# Patient Record
Sex: Male | Born: 1950 | Race: White | Hispanic: No | Smoking: Never smoker
Health system: Southern US, Community
[De-identification: ages and names within clinical notes are randomized; demographics above are authoritative.]

## PROBLEM LIST (undated history)

## (undated) DIAGNOSIS — E785 Hyperlipidemia, unspecified: Secondary | ICD-10-CM

## (undated) DIAGNOSIS — G479 Sleep disorder, unspecified: Secondary | ICD-10-CM

## (undated) DIAGNOSIS — S2249XA Multiple fractures of ribs, unspecified side, initial encounter for closed fracture: Secondary | ICD-10-CM

## (undated) DIAGNOSIS — N200 Calculus of kidney: Secondary | ICD-10-CM

## (undated) DIAGNOSIS — S51009A Unspecified open wound of unspecified elbow, initial encounter: Secondary | ICD-10-CM

## (undated) DIAGNOSIS — S53106A Unspecified dislocation of unspecified ulnohumeral joint, initial encounter: Secondary | ICD-10-CM

## (undated) DIAGNOSIS — I319 Disease of pericardium, unspecified: Secondary | ICD-10-CM

## (undated) DIAGNOSIS — R0602 Shortness of breath: Secondary | ICD-10-CM

## (undated) DIAGNOSIS — C439 Malignant melanoma of skin, unspecified: Secondary | ICD-10-CM

## (undated) DIAGNOSIS — J309 Allergic rhinitis, unspecified: Secondary | ICD-10-CM

## (undated) DIAGNOSIS — R0600 Dyspnea, unspecified: Secondary | ICD-10-CM

## (undated) DIAGNOSIS — S2239XA Fracture of one rib, unspecified side, initial encounter for closed fracture: Secondary | ICD-10-CM

## (undated) DIAGNOSIS — E669 Obesity, unspecified: Secondary | ICD-10-CM

## (undated) HISTORY — DX: Calculus of kidney: N20.0

## (undated) HISTORY — DX: Disease of pericardium, unspecified: I31.9

## (undated) HISTORY — DX: Multiple fractures of ribs, unspecified side, initial encounter for closed fracture: S22.49XA

## (undated) HISTORY — DX: Shortness of breath: R06.02

## (undated) HISTORY — DX: Obesity, unspecified: E66.9

## (undated) HISTORY — DX: Dyspnea, unspecified: R06.00

## (undated) HISTORY — PX: KNEE ARTHROSCOPY: SHX127

## (undated) HISTORY — DX: Unspecified open wound of unspecified elbow, initial encounter: S51.009A

## (undated) HISTORY — DX: Malignant melanoma of skin, unspecified: C43.9

## (undated) HISTORY — DX: Sleep disorder, unspecified: G47.9

## (undated) HISTORY — PX: OTHER SURGICAL HISTORY: SHX169

## (undated) HISTORY — DX: Allergic rhinitis, unspecified: J30.9

## (undated) HISTORY — DX: Unspecified dislocation of unspecified ulnohumeral joint, initial encounter: S53.106A

## (undated) HISTORY — PX: MELANOMA EXCISION WITH SENTINEL LYMPH NODE BIOPSY: SHX5267

## (undated) HISTORY — DX: Hyperlipidemia, unspecified: E78.5

## (undated) HISTORY — DX: Fracture of one rib, unspecified side, initial encounter for closed fracture: S22.39XA

---

## 2004-06-04 HISTORY — PX: OTHER SURGICAL HISTORY: SHX169

## 2005-02-24 ENCOUNTER — Inpatient Hospital Stay (HOSPITAL_COMMUNITY): Admission: EM | Admit: 2005-02-24 | Discharge: 2005-02-26 | Payer: Self-pay | Admitting: Emergency Medicine

## 2005-03-01 ENCOUNTER — Emergency Department (HOSPITAL_COMMUNITY): Admission: EM | Admit: 2005-03-01 | Discharge: 2005-03-01 | Payer: Self-pay | Admitting: Emergency Medicine

## 2005-03-16 ENCOUNTER — Ambulatory Visit: Payer: Self-pay | Admitting: Family Medicine

## 2005-03-26 ENCOUNTER — Ambulatory Visit: Payer: Self-pay | Admitting: Family Medicine

## 2005-03-29 ENCOUNTER — Ambulatory Visit: Payer: Self-pay | Admitting: Family Medicine

## 2005-04-03 ENCOUNTER — Ambulatory Visit: Payer: Self-pay | Admitting: Internal Medicine

## 2005-04-13 ENCOUNTER — Ambulatory Visit: Payer: Self-pay

## 2005-05-02 ENCOUNTER — Ambulatory Visit: Payer: Self-pay | Admitting: Family Medicine

## 2005-05-09 ENCOUNTER — Ambulatory Visit: Payer: Self-pay | Admitting: Internal Medicine

## 2005-05-11 ENCOUNTER — Ambulatory Visit: Payer: Self-pay

## 2005-06-22 ENCOUNTER — Ambulatory Visit (HOSPITAL_COMMUNITY): Admission: RE | Admit: 2005-06-22 | Discharge: 2005-06-23 | Payer: Self-pay | Admitting: Orthopedic Surgery

## 2005-09-05 ENCOUNTER — Ambulatory Visit: Payer: Self-pay | Admitting: Family Medicine

## 2005-10-17 ENCOUNTER — Ambulatory Visit: Payer: Self-pay | Admitting: Family Medicine

## 2005-12-07 ENCOUNTER — Ambulatory Visit: Payer: Self-pay | Admitting: Family Medicine

## 2006-04-17 ENCOUNTER — Ambulatory Visit: Payer: Self-pay | Admitting: Family Medicine

## 2006-09-11 ENCOUNTER — Ambulatory Visit: Payer: Self-pay | Admitting: Family Medicine

## 2006-11-26 ENCOUNTER — Ambulatory Visit: Payer: Self-pay | Admitting: Family Medicine

## 2009-02-16 DIAGNOSIS — S53106A Unspecified dislocation of unspecified ulnohumeral joint, initial encounter: Secondary | ICD-10-CM

## 2009-02-16 DIAGNOSIS — R0602 Shortness of breath: Secondary | ICD-10-CM | POA: Insufficient documentation

## 2009-02-16 DIAGNOSIS — I319 Disease of pericardium, unspecified: Secondary | ICD-10-CM | POA: Insufficient documentation

## 2009-02-16 DIAGNOSIS — S51009A Unspecified open wound of unspecified elbow, initial encounter: Secondary | ICD-10-CM | POA: Insufficient documentation

## 2009-02-16 DIAGNOSIS — E669 Obesity, unspecified: Secondary | ICD-10-CM | POA: Insufficient documentation

## 2009-02-16 DIAGNOSIS — E785 Hyperlipidemia, unspecified: Secondary | ICD-10-CM

## 2009-02-16 DIAGNOSIS — Z9889 Other specified postprocedural states: Secondary | ICD-10-CM

## 2009-02-16 DIAGNOSIS — J309 Allergic rhinitis, unspecified: Secondary | ICD-10-CM | POA: Insufficient documentation

## 2009-02-16 DIAGNOSIS — R079 Chest pain, unspecified: Secondary | ICD-10-CM

## 2010-10-20 NOTE — Op Note (Signed)
NAMEARVIN, [name]             ACCOUNT NO.:  [id number]   MEDICAL RECORD NO.:  [id number]          PATIENT TYPE:  INP   LOCATION:  1825                         FACILITY:  [facility]   PHYSICIAN:  Doralee Albino. Carola Frost, M.D. DATE OF BIRTH:  July 16, 1950   DATE OF PROCEDURE:  02/24/2005  DATE OF DISCHARGE:                                 OPERATIVE REPORT   PREOPERATIVE DIAGNOSIS:  Right open grade 3 fracture dislocation of the  elbow.   POSTOPERATIVE DIAGNOSES:  1.  Right grade 2 fracture dislocation of the elbow involving the proximal      radius and ulna.  2.  Complete lateral collateral ligament disruption.   PROCEDURES:  1.  Irrigation and debridement of open proximal radius and ulnar fractures      and elbow joint.  2.  Ulnar nerve release/neuroplasty.  3.  Open reduction and internal fixation, proximal ulna olecranon process      and shaft.  4.  Radial head arthroplasty using the [employer] #3 stem, press-fit, and 24-      mm head with standard neck.  5.  Open treatment of elbow dislocation   SURGEON:  Myrene Galas, M.D.   ASSISTANT:  Peter Garter, PA   ANESTHESIA:  General.   COMPLICATIONS:  None.   DRAINS:  None.   TOTAL TOURNIQUET TIME:  None used.   DISPOSITION:  To PACU.   CONDITION:  Stable.   BRIEF SUMMARY OF INDICATION FOR PROCEDURE:  [name] [name] is a 60 year old  left-hand-dominant male who fell 22 feet or so through a skylight,  sustaining a right elbow fracture dislocation with a 2.5-cm open wound.  Preoperatively, he underwent full evaluation by the Trauma Service and with  regard to his elbow injury, a full discussion with myself regarding the  risks and benefits of surgery including the possibility of nerve injury,  vessel injury, infection, delayed union, nonunion, decreased elbow range of  motion as well as the possibility of symptomatic hardware and possible need  for further surgery; the patient understood these risks and wished to  proceed.   BRIEF DESCRIPTION OF PROCEDURE:  [name] [name] was administered an additional  gram of Ancef.  He was taken to the operating room where general anesthesia  was induced.  His right upper extremity was prepped and draped in usual  sterile fashion.  No tourniquet was used.  A curvilinear incision beginning  at the subcutaneous border of the ulna and extending medially around to  incorporate the traumatic incision and then curving midline.  Once it  crossed the tip of the olecranon, the skin edges were excised and underlying  fascia and muscle in the area of the open wound debrided .  We then removed  some small cortical fragments which had no periosteal attachments as well.  We continued the exposure distally, where we identified the ulnar nerve in  the proximal aspect of the wound and  did a complete neuroplasty through the  cubital tunnel and beyond into the proximal aspect of the musculature.  Once  the nerve was freely mobilized, we then were able to use 6 L  of pulsatile  irrigation on the bone fragments including the fragment we suspected of  penetrating through the skin, which was based off the olecranon.  Prior to  using the Pulsavac, we curetted all the bone surfaces and again did a  careful surgical debridement.   New drapes were placed and also surgical attire changed and then the deep  excision extended somewhat both proximally and distally along.  We began  with evaluation of the radial head and neck.  The radial head had completely  displaced from the radial shaft and was actually posterior to the distal  humerus at the time of exposure; it was removed prior to the irrigation and  was measured on the back table; it was actually greater than the 24-mm  template available.  We then took a micro-sagittal saw and while delivering  the radial shaft through the olecranon fracture, made a transverse cut which  we then rasped to a smooth, well-formed surface.  It was  sequentially  broached up to a 3 and then the trial implanted.  The elbow was reduced and  the 24 appeared to give an excellent fit, both for height and diameter.  We  then inserted the actual stem and engaged the head with the [name] [name] taper  after first impacting the stem.  The proximal radius was then relocated and  was found have excellent stability and fit.  Attention was then turned to  the olecranon and specifically the coronoid fragment.  The coronoid fragment  actually consisted of 2 pieces and these pieces both had periosteal  attachments with the joint capsule actually attaching to the larger coronoid  segment.  The small piece was reduced to the larger and then fixed with a  threaded guidewire, which was then snipped off at the edge of the extra-  articular surface where it was inserted.  We were able to visualize this  reduction directly and restore it anatomically.  This piece was then  examined to see if it articulated with the olecranon, which it did.  Because  of its sagittal split, however, it was not suitable for screw fixation and  was going to be very prone to fragmentation; consequently, we made at 2  drill holes, 1 through each segment, and passed a #5 FiberWire up through  this, through the capsule and then back down through the other a segment of  the coronoid.  Matching drill holes were then placed into the olecranon and  reduction maneuver performed and examined immediately for reduction and then  tied securely.  These segments then moved as a unit.  A knife was used to  tease back the periosteal edges of the major olecranon segment and the major  shaft segment.  The shaft segment did have an angle which dovetailed nicely  into the proximal piece; this was used to line up and hold the reduction  while a provisional fixation was placed in the spiked olecranon plate.  The  olecranon plate was engaged and held with a large K-wire and then a single 3.5 screw placed in  compression mode distally.  A second screw was placed  distally and then the 2 small  2.7 olecranon screws, leaving them short of  the articular surface.  We then were able to tease multiple comminuted  segments into near-anatomic reduction; the largest of these was lagged into  place under direct visualization.  One additional screw was placed in the  shaft and then the C-arm brought in to obtain  AP and lateral images, which  showed reduction of the elbow joint, appropriate length of the shaft  segments of the ulna as well as proper placement of all fixation.  The elbow  could be brought into full extension without dislocation or subluxation and  similarly, while flexed to 90, it could also be externally rotated in order  to obtain a lateral without any subluxation as well.  The patient was  brought back up to 90 degrees and then the forearm checked for full  pronation and supination, which he had; similarly, his DRUJ was stable to  examination.  The wound was then irrigated once more and closed in standard  layered fashion using 0 Vicryl for the deep periosteum and deep tissues  without a formal closure of the forearm compartments to allow for swelling.  We did repair the tissues around the medial aspect of the elbow underneath  the ulnar nerve rather than over it in order to prevent any tension or  pressure.  The nerve remained in a relaxed position.  It should be noted  that the medial aspect of the epicondyle did have the attachment to the  capsule and capitellum; however, digital examination of the lateral  epicondyle during the initial exposure revealed it to be completely stripped  of soft tissue attachments.  The subcu was closed with 2-0 and the skin with  staples.  A sterile gently compressive dressing was applied and then a  posterior splint with the elbow at 90 degrees in neutral rotation.  The  patient was awakened from anesthesia and transported the PACU in stable   condition.   PROGNOSIS:  [name] [name] has sustained a severe injury, but fortunately did  not have any large segmental defects from his open fractures and was able to  have restoration of near anatomic alignment with regard to his olecranon.  The radial head fit nicely and together, this did provide him with a stable  elbow after full extension.  Because of the open fracture, he remains at  increased risk for infection, delayed union and nonunion.  If his wound  looks good in 2 days, then we will discontinue antibiotics and discharge him  home in a splint at 90 degrees.  In 10 days, we will initiate passive range  of motion.      Doralee Albino. Carola Frost, M.D.  Electronically Signed     MHH/MEDQ  D:  02/24/2005  T:  02/26/2005  Job:  [id number]

## 2010-10-20 NOTE — Discharge Summary (Signed)
[name], [name]             ACCOUNT NO.:  [id number]   MEDICAL RECORD NO.:  [id number]          PATIENT TYPE:  INP   LOCATION:  5004                         FACILITY:  [facility]   PHYSICIAN:  Doralee Albino. Carola Frost, M.D. DATE OF BIRTH:  03/30/1951   DATE OF ADMISSION:  02/24/2005  DATE OF DISCHARGE:  02/26/2005                                 DISCHARGE SUMMARY   ADMISSION DIAGNOSIS:  Right open elbow fracture.   DISCHARGE DIAGNOSIS:  Status post open reduction and internal fixation,  right open elbow fracture.   PROCEDURE PERFORMED:  On February 24, 2005, irrigation and debridement of  right open elbow fracture as well as open reduction and internal fixation.   Consult obtained by trauma service.   HISTORY OF PRESENT ILLNESS:  This is a 60 year old white male who fell  through a skylight while working, was subsequently transported to [facility]  emergency room, where the trauma service initially evaluated this patient  and orthopedics was subsequently consulted.  His chief complaint was pain in  his right elbow, which had an obvious deformity as well as obvious open  aspect to this injury.   PAST MEDICAL HISTORY:  He has increased cholesterol, right knee surgery  while an adolescent, and decreased hearing.   PAST SURGICAL HISTORY:  Right knee surgery.   ALLERGIES:  AUGMENTIN.   MEDICATIONS:  A cholesterol medicine as well as an aspirin.   SOCIAL HISTORY:  Denies any tobacco.  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   PHYSICAL EXAMINATION:  VITAL SIGNS:  He is afebrile, vital signs stable.  ABDOMEN:  Soft, nontender.  EXTREMITIES:  He is distally neurologically and vascularly intact, moving  all four extremities, upper and lower.  He is nontender over all extremities  except for the right upper extremity, more specifically the elbow region.  He is also losing minimal amounts of blood in this time in the right upper  extremity through the open wound to the right elbow.   ASSESSMENT:  The  patient has a right elbow fracture.   Initial chest x-ray showed a possible widened mediastinum.  Trauma service  did order a CT angiogram of the chest and pelvis to determine if there is  any further injury internally.  Those tests did come back negative.  The  patient was then subsequently cleared to proceed with surgery and would be  admitted to Dr. Myrene Galas for further care and treatment while in the  hospital.  On February 24, 2005, the patient then underwent irrigation and  debridement as well as open reduction and internal fixation of his right  elbow fracture.  He tolerated this procedure very well without any  complications.  On postop day 1, the patient is seen, pain is well-  controlled on pain medicine.  Distal neurologic and vascular exam remains  intact in bilateral upper extremities.  Splint and Ace are in place in the  right upper extremity.  We will anticipate discharge in the a.m.  February 26, 2005, the patient is seen once again doing even better than the day  before.  We will repeat x-rays today  prior to discharge, AP and lateral of  his right elbow.  Films were of poor quality initially postoperatively.  He  remains intact.  Incision is clean and intact and well-approximated without  erythema.  Splint is removed and will be replaced by the orthopedic tech  prior to discharge and after x-ray is obtained.  The patient is to be  discharged to home, follow up with Dr. Myrene Galas in approximately 10  days.  The patient will be given to resume all home medicines as well as is  given a prescription for Percocet and oxycodone for pain and breakthrough  pain.  The patient is to call the office with any further questions or  concerns prior to his follow-up date.      Cecil Cranker, PA      Doralee Albino. Carola Frost, M.D.  Electronically Signed    RWC/MEDQ  D:  02/26/2005  T:  02/27/2005  Job:  [id number]

## 2010-10-20 NOTE — Op Note (Signed)
[name], [name]             ACCOUNT NO.:  [id number]   MEDICAL RECORD NO.:  [id number]          PATIENT TYPE:  OIB   LOCATION:  5020                         FACILITY:  [facility]   PHYSICIAN:  Doralee Albino. Carola Frost, M.D. DATE OF BIRTH:  04/10/1951   DATE OF PROCEDURE:  06/22/2005  DATE OF DISCHARGE:                                 OPERATIVE REPORT   PREOPERATIVE DIAGNOSIS:  Right ulnar nonunion.   POSTOPERATIVE DIAGNOSIS:  Right ulnar nonunion.   PROCEDURE:  1.  Repair of right ulnar nonunion using iliac crest bone with tricortical      graft.  2.  Removal of deep implant  3.  Manipulation under anesthesia, right elbow   SURGEON:  Doralee Albino. Carola Frost, M.D.   ASSISTANT:  None.   ANESTHESIA:  General.   COMPLICATIONS:  None.   SPECIMENS:  Bone from the nonunion site sent to microbiology for Gram stain  and culture.   TOTAL TOURNIQUET TIME:  Eighty-three minutes.   DISPOSITION:  To PACU.   CONDITION:  Stable.   INDICATIONS FOR PROCEDURE:  [name] [name] is a 60 year old right-hand  dominant male who sustained a open right elbow fracture dislocation treated  with irrigation, debridement, repair of the ulna and radial head  arthroplasty. The patient has had persistent pain despite extensive rehab  and tenderness at the fracture site as well. CT scan confirmed ulnar  nonunion with a segmental defect. We discussed at length [name] the  risk and benefits of repair of this nonunion as well as the specifics  associated with iliac crest versus allograft. He understood those risks to  include at hip pain, nerve injury including lateral thigh numbness,  infection, neurovascular injury, failure of the nonunion site to progress  toward union and possible need for further surgery as well as other  perioperative complications up. He wished to proceed.   DESCRIPTION OF PROCEDURE:  [name] was administered preoperative Ancef,  taken to operating room where his right upper  extremity was prepped and  draped in the usual sterile fashion after induction of general anesthesia.  We also prepped out his right hip. We began with a 3 cm incision over the  right iliac crest and continued through the subcutaneous fascia until we  encountered the fascial layer from the obliques. We stayed within the  fascial plane all way down to the iliac crest and then elevated the  periosteum proximally with use of the Bovie. We then packed the wound and  turned our attention to the elbow.   We reopened the prior incision but did not extended it up the back of the  humerus. Dissection was carried sharply down to the plate which was then  exposed through elevation of the pseudomembrane. Dissection carried medially  down the side of the ulna where the nonunion site was encountered. There was  extensive fibrous debris within the nonunion site but no fluid collections.  No evidence of infection whatsoever. Curets were used to debride all of this  material. There did appear to be slightly whitened appearance of the distal  aspect of the proximal fragment suggestive  of necrosis. Immediately adjacent  to the last 3 mm or so, however, the bone did show evidence of bleeding when  drilled and rasped. Because of the size of this segment, it was left in  place given the ability of the bone to grow over it like a scaffold and the  absence of any appearance of infection. The material debrided from this area  was sent to path for a Gram stain and culture; Gram stain would eventually  be called in as negative. We then measured the defect which was  approximately 2-1/2 cm.  We returned to the crest and excised a 3 cm wedge  of tricortical graft, shaped it and then implanted it in the nonunion gap  after a thorough irrigation. We placed two lag screws one from distal and  from proximal into this tricortical segment and sucked it down to either  side to bridge the defect. Long cancellous iliac crest  strips were then  placed alongside of the nonunion site as well and packed into any remaining  defects and entirely spanning the nonunion site. This was all performed  after a thorough irrigation.  We then closed the deep layer of the 0 Vicryl,  deflated the tourniquet and continued with 2-0 Vicryl and staples for  closure.   The iliac crest wound was closed with interrupted figure-of-eight 0 Vicryls  after of removing some Gelfoam had been placed initially into the area of  the graft harvest and then 2-0 for the subcutaneous and staples for the  skin. Sterile gently compressive dressings were applied. The patient was  awakened from anesthesia and transported to the PACU in stable condition  after application of a posterior and lateral slab splint for the elbow up.   PROGNOSIS:  [name] should do well following this repair of his  nonunion. There did not appear to be any evidence of infection and we were  able to use a tricortical graft to completely bridge the defect as well as  multiple cancellous strips of iliac crest. We did also place an additional  screw into the proximal segment for further purchase and this should  alleviate any stress on the implant. We removed a screw that was loose at  that time and I checked all the other hardware which remained well fixed.  Because of his previous open fracture and nonunion he is, of course, at  increased risk for persistent nonunion.      Doralee Albino. Carola Frost, M.D.  Electronically Signed     MHH/MEDQ  D:  06/22/2005  T:  06/22/2005  Job:  [id number]

## 2010-10-20 NOTE — Consult Note (Signed)
NAMEFERRELL, CLAIBORNE             ACCOUNT NO.:  [id number]   MEDICAL RECORD NO.:  [id number]          PATIENT TYPE:  EMS   LOCATION:  MAJO                         FACILITY:  [facility]   PHYSICIAN:  Doralee Albino. Carola Frost, M.D. DATE OF BIRTH:  1950-08-01   DATE OF CONSULTATION:  02/24/2005  DATE OF DISCHARGE:                                   CONSULTATION   REFERRING PHYSICIANS:  1.  Lorre Nick, M.D.  2.  Cherylynn Ridges, M.D.   REASON FOR CONSULTATION:  Open right elbow fracture dislocation.   BRIEF HISTORY OF PRESENT ILLNESS:  Patient is a 60 year old white male who  fell through a skylight onto a cement floor, landing on his right elbow.  He  was transported to the ER for further evaluation.  Widened mediastinum on  initial chest x-ray prompted a CT scan of the chest as well as the head,  abdomen and pelvis.  These were negative for fracture or significant  findings.   PAST MEDICAL HISTORY:  1.  High cholesterol.  2.  Right knee surgery.   PAST SURGICAL HISTORY:  As above.   SOCIAL HISTORY:  The patient does not smoke.  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   MEDICATIONS:  Cholesterol lowering agent and aspirin.   PHYSICAL EXAMINATION:  His vital signs are stable.  He is afebrile.  His  right upper extremity was notable for an open wound over the extensor  surface of the elbow which is currently wrapped.  The bandage was not  removed at the time of evaluation as the patient was scheduled for emergent  surgery.  There was obvious deformity of the elbow.  There was no tenderness  proximally of the humerus or shoulder.  Distally, there was no significant  tenderness of the wrist.  Radial pulses 2+.  Radial, mean and ulnar  sensation was intact as was radial, mean and ulnar motor function including  posterior interosseus and anterior interosseus branches.  His left upper  extremity was free of focal tenderness, ecchymosis or swelling.  His pelvis  was stable, nontender  to palpation.  His right and left lower extremities  about the hips, knees, ankles and feet were nontender without swelling or  ecchymosis. Deep peroneal, superficial peroneal and tibial nerve sensory and  motor function was intact.  Dorsalis pedis pulses 2+ bilaterally.   X-rays demonstrate highly comminuted fracture of the proximal radius and  ulna with dislocation.  There does not appear to be any fracture nor  dislocation at the DRUJ.  Similarly the one view provided of the proximal  humerus does not show  fracture abnormality.  CT scan was also obtained  which, on the axial cuts, which are all that are available for review at the  current dictation, do not demonstrate any involvement of the distal humerus.   ASSESSMENT:  Open grade III proximal radius and ulnar fracture dislocation  in nondominant arm.   PLAN:  I have discussed with Mr. Millikan and has also been discussed with  his daughter, the possible treatments and our recommendation for immediate  irrigation  and debridement with primary fixation of this intra-articular  fracture given the anticipated lack of contamination.  Should the wound be  grossly contaminated or there are concerns in that regard, then we would  likely apply an external fixator provisionally and schedule a return to the  OR for definitive fixation.  He is at increased risk for infection, delayed  union, nonunion, decreased range of motion, arthritis.  He is also at risk  for nerve and vessel injection secondary to the comminuted nature and the  required exposure for this fracture.  After full discussion of the risks and  benefits, the patient and his daughter wish to proceed.      Doralee Albino. Carola Frost, M.D.  Electronically Signed     MHH/MEDQ  D:  02/24/2005  T:  02/26/2005  Job:  [id number]   cc:   Lorre Nick, M.D.  Fax: [phone]   Cherylynn Ridges, M.D.  [address], [address]  [place]  [place] [address]

## 2011-01-19 DIAGNOSIS — C439 Malignant melanoma of skin, unspecified: Secondary | ICD-10-CM

## 2011-01-19 HISTORY — DX: Malignant melanoma of skin, unspecified: C43.9

## 2011-01-29 ENCOUNTER — Ambulatory Visit (INDEPENDENT_AMBULATORY_CARE_PROVIDER_SITE_OTHER): Payer: Self-pay | Admitting: General Surgery

## 2011-04-25 DIAGNOSIS — I890 Lymphedema, not elsewhere classified: Secondary | ICD-10-CM | POA: Insufficient documentation

## 2011-05-24 ENCOUNTER — Ambulatory Visit: Payer: Self-pay | Admitting: Family Medicine

## 2011-06-01 ENCOUNTER — Ambulatory Visit (INDEPENDENT_AMBULATORY_CARE_PROVIDER_SITE_OTHER): Payer: Self-pay | Admitting: Family Medicine

## 2011-06-01 DIAGNOSIS — I10 Essential (primary) hypertension: Secondary | ICD-10-CM

## 2011-06-01 DIAGNOSIS — G4700 Insomnia, unspecified: Secondary | ICD-10-CM

## 2011-06-13 ENCOUNTER — Ambulatory Visit: Payer: Self-pay

## 2011-06-13 DIAGNOSIS — S20219A Contusion of unspecified front wall of thorax, initial encounter: Secondary | ICD-10-CM

## 2011-06-13 DIAGNOSIS — S2249XA Multiple fractures of ribs, unspecified side, initial encounter for closed fracture: Secondary | ICD-10-CM

## 2011-06-20 ENCOUNTER — Ambulatory Visit: Payer: Self-pay

## 2011-06-20 DIAGNOSIS — S2239XA Fracture of one rib, unspecified side, initial encounter for closed fracture: Secondary | ICD-10-CM

## 2011-06-27 ENCOUNTER — Ambulatory Visit: Payer: Self-pay | Admitting: Family Medicine

## 2011-06-27 DIAGNOSIS — I10 Essential (primary) hypertension: Secondary | ICD-10-CM

## 2011-07-04 ENCOUNTER — Ambulatory Visit (INDEPENDENT_AMBULATORY_CARE_PROVIDER_SITE_OTHER): Payer: Self-pay | Admitting: Emergency Medicine

## 2011-07-04 VITALS — BP 138/84 | HR 60 | Temp 98.7°F | Resp 18 | Ht 69.0 in | Wt 250.0 lb

## 2011-07-04 DIAGNOSIS — K439 Ventral hernia without obstruction or gangrene: Secondary | ICD-10-CM

## 2011-07-04 DIAGNOSIS — C439 Malignant melanoma of skin, unspecified: Secondary | ICD-10-CM

## 2011-07-04 DIAGNOSIS — G4733 Obstructive sleep apnea (adult) (pediatric): Secondary | ICD-10-CM

## 2011-07-04 NOTE — Progress Notes (Signed)
  Subjective:    Patient ID: Mark [name], male    DOB: 04-24-1951, 61 y.o.   MRN: [id number]  HPI Patient complains of fatigue and falls asleep immediately after he sits down.      Review of Systems Falls asleep as soon as he sits down     Objective:   Physical Exam  Constitutional: He appears well-developed.  Cardiovascular: Normal rate.   Pulmonary/Chest: No respiratory distress. He has no wheezes. He has no rales.  Abdominal:       Large ventral hernia          Assessment & Plan:  Told to avoid surgery for hernia. Will schedule sleep study

## 2011-07-04 NOTE — Patient Instructions (Signed)
Told to avoid surgery for hernia. Will contact regarding sleep study

## 2011-07-05 ENCOUNTER — Telehealth: Payer: Self-pay

## 2011-07-05 DIAGNOSIS — R0781 Pleurodynia: Secondary | ICD-10-CM

## 2011-07-05 NOTE — Telephone Encounter (Signed)
SPOKE WITH PT AND HE WOULD LIKE PERCOCET 5/325 REFILLED FOR HIS PAIN IN [facility]. HE FORGOT TO ASK DR [facility] FOR REFILL

## 2011-07-05 NOTE — Telephone Encounter (Signed)
Pt says he was in to see the Dr last night and her forgot to ask for one of his prescriptions, the pharmacy has never filled this for him

## 2011-07-06 ENCOUNTER — Other Ambulatory Visit: Payer: Self-pay | Admitting: Family Medicine

## 2011-07-06 ENCOUNTER — Telehealth: Payer: Self-pay

## 2011-07-06 DIAGNOSIS — R0781 Pleurodynia: Secondary | ICD-10-CM

## 2011-07-06 MED ORDER — OXYCODONE-ACETAMINOPHEN 5-325 MG PO TABS: 1.0000 | ORAL_TABLET | Freq: Three times a day (TID) | ORAL | Status: AC | PRN

## 2011-07-06 MED ORDER — OXYCODONE-ACETAMINOPHEN 5-325 MG PO TABS: 1.0000 | ORAL_TABLET | Freq: Three times a day (TID) | ORAL | Status: DC | PRN

## 2011-07-06 NOTE — Telephone Encounter (Signed)
Spoke with pt and told him Rx ready for p/up. Pt will get tomorrow.

## 2011-07-06 NOTE — Telephone Encounter (Signed)
Checked to make sure that Percocet Rx was in drawer for p/up and it was not. Pt had called previously requesting RF. Called pt back and told him Rx is not ready for p/up and provider will have to review to see if it can be filled, and we will let him know at that time. Pt agreed.

## 2011-07-07 NOTE — Telephone Encounter (Signed)
Left message on machine that prescription is at front desk to pick up.

## 2011-07-26 ENCOUNTER — Telehealth: Payer: Self-pay

## 2011-07-26 NOTE — Telephone Encounter (Signed)
.  [facility] ANN FROM [facility] IS A THERAPIST AND WOULD LIKE TO SPEAK WITH DR MCPHERSON OR HER NURSE REGARDING PT'S BLOOD PRESSURE SHE IS REALLY CONCERNED ABOUT IT. PLEASE CALL [phone]

## 2011-07-26 NOTE — Telephone Encounter (Signed)
Thank you for that information. I think that was the appropriate course of action as I did not have an opportunity to return her call immediately.

## 2011-07-26 NOTE — Telephone Encounter (Signed)
Spoke with therapist at [facility].  She stated that pt bp was high, lethargic, sob, and falling asleep when bp was being taken.  She sent pt down to the ER.  Dr. Barbarann Ehlers

## 2011-07-27 ENCOUNTER — Ambulatory Visit: Payer: Self-pay | Admitting: Family Medicine

## 2011-07-27 ENCOUNTER — Telehealth: Payer: Self-pay

## 2011-07-27 NOTE — Telephone Encounter (Signed)
Patient was put on BP med from Dr Audria Nine.  His BP was 96/50 today. He feels light headed.  He scheduled an appointment for next wed am, but would like to speak to someone prior since he is having problems.  Additionally, he was supposed to be scheduled for a sleep study per Dr Cleta Alberts and he has not heard anything yet.  Please call him back.

## 2011-07-27 NOTE — Telephone Encounter (Signed)
I spoke with the pt who c/o lightheadedness and episodes of low BP.  BP readings had been 130/70 on current medication which is 1/2 tablet daily. His Occupational Therapy is on hold because of BP instability. He is also wondering about Sleep Study because of daytime fatigue and sleepiness.  He is advised to stop BP med until seen by me on Wed AM and limit activities. He will monitor BP and log them for review at visit. He voices understanding.

## 2011-07-27 NOTE — Telephone Encounter (Signed)
Dr Orson Ape pleas advise on BP.  I did not see an order for sleep study.

## 2011-07-31 ENCOUNTER — Encounter: Payer: Self-pay | Admitting: *Deleted

## 2011-08-01 ENCOUNTER — Encounter: Payer: Self-pay | Admitting: Family Medicine

## 2011-08-01 ENCOUNTER — Ambulatory Visit (INDEPENDENT_AMBULATORY_CARE_PROVIDER_SITE_OTHER): Payer: Self-pay | Admitting: Family Medicine

## 2011-08-01 ENCOUNTER — Other Ambulatory Visit: Payer: Self-pay | Admitting: Emergency Medicine

## 2011-08-01 DIAGNOSIS — G4733 Obstructive sleep apnea (adult) (pediatric): Secondary | ICD-10-CM

## 2011-08-01 DIAGNOSIS — I10 Essential (primary) hypertension: Secondary | ICD-10-CM

## 2011-08-01 DIAGNOSIS — R197 Diarrhea, unspecified: Secondary | ICD-10-CM

## 2011-08-01 DIAGNOSIS — R609 Edema, unspecified: Secondary | ICD-10-CM

## 2011-08-01 LAB — LIPID PANEL
HDL: 24 mg/dL — ABNORMAL LOW (ref >39)
Triglycerides: 158 mg/dL — ABNORMAL HIGH (ref <150)

## 2011-08-01 LAB — COMPREHENSIVE METABOLIC PANEL
ALT: 21 U/L (ref 0–53)
AST: 24 U/L (ref 0–37)
CO2: 21 mEq/L (ref 19–32)
Chloride: 107 mEq/L (ref 96–112)
Total Protein: 6.5 g/dL (ref 6.0–8.3)

## 2011-08-01 NOTE — Progress Notes (Signed)
  Subjective:    Patient ID: Mark [name], male    DOB: 09/11/1950, 61 y.o.   MRN: [id number]  HPI   This 61 y.o. Cauc male returns for BP evaluation, having had episodes of low BP which has  resulted in interruption of Occupational Therapy. Has continued to exercise on treadmill for 20  minutes   twice a day;  Has 2 cups of regular coffee every morning. Now has gurgling in stomach for tw days  with loose stools. Has hx of indigestion requiring TUMS but not recently. No bad food lately and no exposure  to sick persons.  Has had a few spots of blood due to hemorrhoids but no frank bleeding. Denies abdominal  pain.    Review of Systems  Constitutional: Negative for fever, chills, appetite change and fatigue.  Cardiovascular: Positive for leg swelling. Negative for chest pain.       Right leg swelling- chronic  Gastrointestinal: Positive for diarrhea, blood in stool and abdominal distention. Negative for nausea, vomiting and abdominal pain.  Neurological: Negative.   All other systems reviewed and are negative.       Objective:   Physical Exam  Nursing note and vitals reviewed. Constitutional: He is oriented to person, place, and time. He appears well-developed and well-nourished. No distress.  HENT:  Head: Normocephalic and atraumatic.  Cardiovascular: Normal rate and regular rhythm.   Pulmonary/Chest: Effort normal. No respiratory distress.  Abdominal: He exhibits distension. He exhibits no mass. There is no tenderness. There is no guarding.  Neurological: He is alert and oriented to person, place, and time.  Skin: Skin is warm and dry. No pallor.          Assessment & Plan:   1. HTN (hypertension)  Comprehensive metabolic panel, CBC, Lipid panel Continue to hold BP med for now and monitor  2. Edema  Vitamin D, 25-hydroxy  3. Diarrhea  H. pylori antibody, IgG

## 2011-08-01 NOTE — Patient Instructions (Signed)
Helicobacter Pylori Disease Often patients with stomach or duodenal ulcers not caused by irritants, are infected with a germ. The germ is called helicobacter pylori (H. pylori). This bacterium lives on the surface of stomach and small bowel. It can cause redness, soreness and ulcers. Ulcers are a hole in the lining of your stomach or small bowel. Blood and special breath tests can detect if you are infected with H. pylori. Tests can be done on samples taken from the stomach if you have endoscopy. After treatment you may have tests to prove you are cured. These can be done about a month after you finish the treatment or as your caregiver suggests. Most infections can be cured with a combination of antibiotics. Antibiotics are medications which kill germs such as H. pylori. Anti-ulcer medicines which block stomach acid secretion may also be used. Treatment will be continued for the time your caregiver suggests. Call your caregiver if you need more information about H. pylori. Call also if your symptoms get worse during or after treatment. You will not need a special diet. Avoid:  Smoking.   Aspirin.   Ibuprofen.   Other anti-inflammatory drugs.  Alcohol and spicy foods may also make your symptoms worse. The best advice is to avoid anything you find upsetting to your stomach. SEEK IMMEDIATE MEDICAL CARE IF:  You develop sharp, sudden, lasting stomach pain.   You have bloody vomit or vomit that looks like coffee grounds.   You have bloody or black stools.   You develop a lightheaded feeling, fainting, or become weak and sweaty.  Document Released: 05/21/2005 Document Revised: 01/31/2011 Document Reviewed: 11/06/2006 [facility] Patient Information 2012 [place], [place].

## 2011-08-02 ENCOUNTER — Other Ambulatory Visit: Payer: Self-pay | Admitting: Family Medicine

## 2011-08-02 ENCOUNTER — Telehealth: Payer: Self-pay

## 2011-08-02 DIAGNOSIS — R197 Diarrhea, unspecified: Secondary | ICD-10-CM

## 2011-08-02 LAB — CBC
HCT: 40.8 % (ref 39.0–52.0)
Hemoglobin: 12.7 g/dL — ABNORMAL LOW (ref 13.0–17.0)
MCH: 26.3 pg (ref 26.0–34.0)

## 2011-08-02 LAB — H. PYLORI ANTIBODY, IGG: H Pylori IgG: 0.68 {ISR}

## 2011-08-02 NOTE — Telephone Encounter (Signed)
Pt needs to do stool collection for cultures to rule out Clostridium and routine cultures ( not sure if he consumed something that is causing this). Call pt to come to [address]  on Friday  AM (08/03/11)  to pick up  Stools collection kit. I will place the future order and Ramona will advise him what to do with the Kit when he comes to pick it up.

## 2011-08-02 NOTE — Telephone Encounter (Signed)
Pt called back to report he had about 12 episodes of watery diarrhea, with nothing formed, since he saw Dr Audria Nine yesterday and there was a little more blood in it than just spots, although still not a lot of blood. Pt has no fever, no nausea,abdominal pain and reports he is drinking a lot of water and Gatorade and eating well. But wanted to report increase in episodes and blood. Any new instructions bf we get test results back?

## 2011-08-02 NOTE — Telephone Encounter (Signed)
Pt was seen yesterday by Dr Audria Nine.  He has been ill all evening and would like someone to call.

## 2011-08-03 ENCOUNTER — Telehealth: Payer: Self-pay

## 2011-08-03 NOTE — Telephone Encounter (Signed)
Stool tests not necessary since pt is so much better.

## 2011-08-03 NOTE — Telephone Encounter (Signed)
Dr. Audria Nine has not reviewed labs. Spoke with patient and was notified will call as soon as labs reviewed.

## 2011-08-03 NOTE — Telephone Encounter (Signed)
.  [facility] PT HAD LABS DONE AND WOULD LIKE TO KNOW RESULTS PLEASE CALL [phone]

## 2011-08-03 NOTE — Progress Notes (Signed)
Quick Note:  Please call pt and advise that the following labs are abnormal... All his results look good except Vitamin D level is low. Advise OTC Vitamin D 1000 IU capsules and take 1 cap every day. ______

## 2011-08-03 NOTE — Telephone Encounter (Signed)
Called pt to give instructions to test and he stated he is so much better. He has only had one more BM since we spoke yesterday and is getting appetite back, and just feels better. He thinks he probably had a stomach virus, or ate something that upset him. He will call back if worsens/or persists to come get kit. Dr Audria Nine, please advise if you still want him to do stool test now.

## 2011-08-08 ENCOUNTER — Other Ambulatory Visit: Payer: Self-pay | Admitting: Family Medicine

## 2011-08-10 ENCOUNTER — Other Ambulatory Visit: Payer: Self-pay | Admitting: Emergency Medicine

## 2011-08-10 DIAGNOSIS — G4733 Obstructive sleep apnea (adult) (pediatric): Secondary | ICD-10-CM

## 2011-08-15 ENCOUNTER — Ambulatory Visit: Payer: Self-pay | Admitting: Family Medicine

## 2011-08-15 ENCOUNTER — Encounter: Payer: Self-pay | Admitting: Family Medicine

## 2011-08-15 VITALS — BP 148/95 | HR 77 | Temp 98.2°F | Resp 16 | Ht 68.0 in | Wt 249.0 lb

## 2011-08-15 DIAGNOSIS — I10 Essential (primary) hypertension: Secondary | ICD-10-CM

## 2011-08-15 DIAGNOSIS — J309 Allergic rhinitis, unspecified: Secondary | ICD-10-CM

## 2011-08-15 MED ORDER — HYDROCHLOROTHIAZIDE 25 MG PO TABS: ORAL_TABLET | ORAL | Status: DC

## 2011-08-15 MED ORDER — CETIRIZINE HCL 10 MG PO TABS: ORAL_TABLET | ORAL | Status: DC

## 2011-08-15 NOTE — Patient Instructions (Signed)
Allergic Rhinitis  Allergic rhinitis is when the mucous membranes in the nose respond to allergens. Allergens are particles in the air that cause your body to have an allergic reaction. This causes you to release allergic antibodies. Through a chain of events, these eventually cause you to release histamine into the blood stream (hence the use of antihistamines). Although meant to be protective to the body, it is this release that causes your discomfort, such as frequent sneezing, congestion and an itchy runny nose.    CAUSES    The pollen allergens may come from grasses, trees, and weeds. This is seasonal allergic rhinitis, or "hay fever." Other allergens cause year-round allergic rhinitis (perennial allergic rhinitis) such as house dust mite allergen, pet dander and mold spores.    SYMPTOMS     Nasal stuffiness (congestion).   Runny, itchy nose with sneezing and tearing of the eyes.   There is often an itching of the mouth, eyes and ears.  It cannot be cured, but it can be controlled with medications.  DIAGNOSIS    If you are unable to determine the offending allergen, skin or blood testing may find it.  TREATMENT     Avoid the allergen.   Medications and allergy shots (immunotherapy) can help.   Hay fever may often be treated with antihistamines in pill or nasal spray forms. Antihistamines block the effects of histamine. There are over-the-counter medicines that may help with nasal congestion and swelling around the eyes. Check with your caregiver before taking or giving this medicine.  If the treatment above does not work, there are many new medications your caregiver can prescribe. Stronger medications may be used if initial measures are ineffective. Desensitizing injections can be used if medications and avoidance fails. Desensitization is when a patient is given ongoing shots until the body becomes less sensitive to the allergen. Make sure you follow up with your caregiver if problems continue.  SEEK  MEDICAL CARE IF:     You develop fever (more than 100.5 F (38.1 C).   You develop a cough that does not stop easily (persistent).   You have shortness of breath.   You start wheezing.   Symptoms interfere with normal daily activities.  Document Released: 02/13/2001 Document Revised: 05/10/2011 Document Reviewed: 08/25/2008  [employer] Patient Information 2012 [employer].

## 2011-08-15 NOTE — Progress Notes (Signed)
  Subjective:    Patient ID: Mark [name], male    DOB: [birth year], 61 y.o.   MRN: [id number]  HPI  The pt RTC for BP re-check. HE has been off medication and readings at home  130- 180/ 85-105.  He denies any symptoms such as chest pain, dizziness, palpitations or lightheadedness. He had a brief episode   or diarrhea with nausea; this resolved with 24-48 hours and he feels fine.  Review of Systems  As per HPI     Objective:   Physical Exam  Vitals reviewed. Constitutional: He is oriented to person, place, and time. He appears well-developed and well-nourished. No distress.  HENT:  Head: Normocephalic and atraumatic.  Nose: Nose normal.  Mouth/Throat: Oropharynx is clear and moist.  Eyes: Conjunctivae and EOM are normal. No scleral icterus.  Cardiovascular: Normal rate, regular rhythm and normal heart sounds.   Pulmonary/Chest: Effort normal and breath sounds normal. No respiratory distress.  Musculoskeletal: He exhibits no edema.  Neurological: He is alert and oriented to person, place, and time. No cranial nerve deficit.  Skin: Skin is warm and dry.  Psychiatric: He has a normal mood and affect. His behavior is normal.          Assessment & Plan:   1. HTN (hypertension)       Rx; HCTZ 25 mg   1/2 tab every AM  Also Rx:  Cetirizine  10 mg  1 tab daily  every PM for allergy symptoms.

## 2011-08-20 ENCOUNTER — Encounter: Payer: Self-pay | Admitting: Family Medicine

## 2011-09-19 ENCOUNTER — Encounter: Payer: Self-pay | Admitting: Family Medicine

## 2011-09-19 ENCOUNTER — Ambulatory Visit (INDEPENDENT_AMBULATORY_CARE_PROVIDER_SITE_OTHER): Payer: Self-pay | Admitting: Family Medicine

## 2011-09-19 VITALS — BP 131/91 | HR 74 | Temp 96.9°F | Resp 18 | Ht 67.0 in | Wt 251.0 lb

## 2011-09-19 DIAGNOSIS — K625 Hemorrhage of anus and rectum: Secondary | ICD-10-CM

## 2011-09-19 DIAGNOSIS — I10 Essential (primary) hypertension: Secondary | ICD-10-CM

## 2011-09-19 MED ORDER — LISINOPRIL 5 MG PO TABS: 5.0000 mg | ORAL_TABLET | Freq: Every day | ORAL | Status: DC

## 2011-09-19 NOTE — Progress Notes (Signed)
  Subjective:    Patient ID: Mark [name], male    DOB: 12-May-1951, 61 y.o.   MRN: [id number]  HPI The pt returns today for recheck prior to returning to work.  He is having lower extremity edema;  he has been trying to stay active and is up on his feet almost all day. He is supposed to wear a compression  stocking at night but he is not some nights. BP at home this AM = 147/102. Notes show readings  between 134- 178/ 86-119.      He is having some bleeding from  hemorrhoids (tries to avoid constipation).He had a normal colonoscopy  at age 66.    Review of Systems As per HPI; denies CP, HA, Dizziness, Weakness or Numbness     Objective:   Physical Exam  Vitals reviewed. Constitutional: He is oriented to person, place, and time. He appears well-developed and well-nourished. No distress.  HENT:  Head: Normocephalic and atraumatic.  Eyes: Conjunctivae and EOM are normal. No scleral icterus.  Cardiovascular: Normal rate.   Pulmonary/Chest: Effort normal. No respiratory distress.  Abdominal: Soft. He exhibits no distension and no mass. There is no tenderness.  Genitourinary:       External rectal - no  External hemorrhoids  Musculoskeletal: He exhibits no edema.       Right lower ext with chronic nonpitting swelling and erythema  Neurological: He is alert and oriented to person, place, and time. No cranial nerve deficit.  Psychiatric: He has a normal mood and affect. His behavior is normal. Thought content normal.          Assessment & Plan:   1. HTN (hypertension)  Add Lisinopril 5 mg  1 tab daily with current medication  2. Bright red rectal bleeding  CRS  ~ 5 years ago per pt; consider GI referral if Home screening tests are positive  3. Rectal bleeding  Hemoccult - Multi Cards (take home)

## 2011-09-19 NOTE — Patient Instructions (Signed)
You have a new blood pressure medication- Lisinopril 5 mg  Take 1 tablet every day with the medication you are taking now (HCTZ 25 mg  1/2 tablet every day). Do your stool collection at home and return it to our office by mail as soon as possible.

## 2011-09-20 ENCOUNTER — Encounter: Payer: Self-pay | Admitting: Family Medicine

## 2011-09-27 ENCOUNTER — Ambulatory Visit: Payer: Self-pay | Admitting: Family Medicine

## 2011-11-21 ENCOUNTER — Ambulatory Visit: Payer: Self-pay | Admitting: Family Medicine

## 2011-12-04 ENCOUNTER — Ambulatory Visit: Payer: Self-pay | Admitting: Family Medicine

## 2011-12-04 ENCOUNTER — Ambulatory Visit: Payer: Self-pay

## 2011-12-04 VITALS — BP 124/78 | HR 69 | Temp 98.2°F | Resp 16 | Ht 68.75 in | Wt 249.0 lb

## 2011-12-04 DIAGNOSIS — R0602 Shortness of breath: Secondary | ICD-10-CM

## 2011-12-04 DIAGNOSIS — R110 Nausea: Secondary | ICD-10-CM

## 2011-12-04 DIAGNOSIS — R5382 Chronic fatigue, unspecified: Secondary | ICD-10-CM

## 2011-12-04 DIAGNOSIS — R5383 Other fatigue: Secondary | ICD-10-CM

## 2011-12-04 DIAGNOSIS — R51 Headache: Secondary | ICD-10-CM

## 2011-12-04 LAB — COMPREHENSIVE METABOLIC PANEL
ALT: 13 U/L (ref 0–53)
CO2: 27 mEq/L (ref 19–32)
Calcium: 9.3 mg/dL (ref 8.4–10.5)
Chloride: 103 mEq/L (ref 96–112)
Sodium: 139 mEq/L (ref 135–145)
Total Protein: 6.6 g/dL (ref 6.0–8.3)

## 2011-12-04 LAB — POCT CBC
HCT, POC: 41.6 % — AB (ref 43.5–53.7)
Hemoglobin: 13.1 g/dL — AB (ref 14.1–18.1)
Lymph, poc: 2 (ref 0.6–3.4)
MCHC: 31.5 g/dL — AB (ref 31.8–35.4)
MCV: 87.1 fL (ref 80–97)
POC Granulocyte: 5.1 (ref 2–6.9)

## 2011-12-04 LAB — GLUCOSE, POCT (MANUAL RESULT ENTRY): POC Glucose: 94 mg/dl (ref 70–99)

## 2011-12-04 MED ORDER — OMEPRAZOLE 40 MG PO CPDR: 40.0000 mg | DELAYED_RELEASE_CAPSULE | Freq: Every day | ORAL | Status: DC

## 2011-12-04 NOTE — Patient Instructions (Addendum)
We will refer you to a cardiologist to assess your fatigue. If you get more fatigue or shortness of breath go to the emergency room if severe. Otherwise come back. We'll let you know the rest of your labs when I get those results.  Take stomach pill once daily.

## 2011-12-04 NOTE — Progress Notes (Signed)
Subjective: 61 year old white male who is here complaining of shortness of breath during the night last night. He was fine yesterday. His life is a little limited in activity due to his lymphedema of the right leg. He had a fairly normal day however. Last night he was fine, went to bed at about 10:00. At 1:15 he awakened feeling short of breath. He little bit nauseated and Burping a lot. Did not have any chest pain. He had a little headache. He just didn't feel well. He went back to bed and after drinking some water. He laid there for a couple of hours before he does for a few hours before getting on up. He still felt weak and bad this morning. He had some coffee for breakfast.  Past history includes the lymphedema. He also has high blood pressure he is being treated for. He had one episode in 2006 and he is given a couple of shots and sent to the emergency room it may have been a little bit similar to this. Graft objective:  Overweight white male. He is off long-leg compression brace on his right leg. His TMs are normal. He wears a hearing aid on the left. His throat is clear. Neck supple without nodes thyromegaly. Chest is clear. Heart regular without murmurs. Abdomen soft is. Is a little bit tender in the epigastrium. Right leg has the lymphedema as noted above, but is not tender. Negative Homans. Left leg is normal.  Assessment: Dyspnea Nausea and burping Headache Generalized malaise Hx melanoma   Plan: Check EKG, chest x-ray, glucose, CBC  Results for orders placed in visit on 12/04/11  POCT CBC      Component Value Range   WBC 7.6  4.6 - 10.2 K/uL   Lymph, poc 2.0  0.6 - 3.4   POC LYMPH PERCENT 26.8  10 - 50 %L   MID (cbc) 0.5  0 - 0.9   POC MID % 6.2  0 - 12 %M   POC Granulocyte 5.1  2 - 6.9   Granulocyte percent 67.0  37 - 80 %G   RBC 4.78  4.69 - 6.13 M/uL   Hemoglobin 13.1 (*) 14.1 - 18.1 g/dL   HCT, POC 40.9 (*) 81.1 - 53.7 %   MCV 87.1  80 - 97 fL   MCH, POC 27.4  27 -  31.2 pg   MCHC 31.5 (*) 31.8 - 35.4 g/dL   RDW, POC 91.4     Platelet Count, POC 251  142 - 424 K/uL   MPV 7.8  0 - 99.8 fL  GLUCOSE, POCT (MANUAL RESULT ENTRY)      Component Value Range   POC Glucose 94  70 - 99 mg/dl   [facility] reading (PRIMARY) by  Dr. Alwyn Ren Normal cxr  EKG nonspecific changes Will check labs. Cardio referral for eval  .

## 2011-12-12 ENCOUNTER — Ambulatory Visit: Payer: Self-pay | Admitting: Family Medicine

## 2011-12-27 ENCOUNTER — Ambulatory Visit: Payer: Self-pay | Admitting: Family Medicine

## 2012-01-01 ENCOUNTER — Encounter: Payer: Self-pay | Admitting: *Deleted

## 2012-01-08 ENCOUNTER — Encounter: Payer: Self-pay | Admitting: Cardiology

## 2012-01-10 ENCOUNTER — Ambulatory Visit: Payer: Self-pay | Admitting: Family Medicine

## 2012-01-29 ENCOUNTER — Ambulatory Visit: Payer: Self-pay | Admitting: Family Medicine

## 2012-02-12 ENCOUNTER — Ambulatory Visit (INDEPENDENT_AMBULATORY_CARE_PROVIDER_SITE_OTHER): Payer: Self-pay | Admitting: Cardiology

## 2012-02-12 ENCOUNTER — Encounter: Payer: Self-pay | Admitting: Cardiology

## 2012-02-12 VITALS — BP 140/90 | HR 67 | Ht 68.75 in | Wt 246.0 lb

## 2012-02-12 DIAGNOSIS — R0609 Other forms of dyspnea: Secondary | ICD-10-CM

## 2012-02-12 DIAGNOSIS — R0602 Shortness of breath: Secondary | ICD-10-CM

## 2012-02-12 DIAGNOSIS — R0683 Snoring: Secondary | ICD-10-CM

## 2012-02-12 DIAGNOSIS — I10 Essential (primary) hypertension: Secondary | ICD-10-CM

## 2012-02-12 LAB — BRAIN NATRIURETIC PEPTIDE: Pro B Natriuretic peptide (BNP): 22 pg/mL (ref 0.0–100.0)

## 2012-02-12 MED ORDER — LISINOPRIL 10 MG PO TABS: 10.0000 mg | ORAL_TABLET | Freq: Every day | ORAL | Status: DC

## 2012-02-12 NOTE — Progress Notes (Signed)
Patient ID: Mark [name], male   DOB: Apr 10, 1951, 61 y.o.   MRN: [id number] PCP: Dr. Audria Nine  61 yo with history of HTN and melanoma presents for cardiology evaluation.  He has right leg lymphedema since groin lymph node dissection for melanoma treatment.  He wears a boot to his thigh to try to control the lymphedema.  One day in 7/13, he woke up with dyspnea at around 2 am.  This lasted for around 2 hrs then resolved spontaneously.  He trouble swallowing during the episode.  No chest pain.  Since that time, he has had no recurrence of the unusual dyspnea.  Prior to the episode, he had had no problems.  At baseline, he walks for exercise and can walk up steps without dyspnea or chest pain.  He does report some snoring and daytime sleepiness.  He had a sleep study in the late '90s but is not using CPAP.  BP has been running high at home and is 140/90 today.   ECG (from 7/13): NSR, mildly prolonged QT interval.    Labs (2/13): LDL 104, HDL 24 Labs (7/13): K 3.9, creatinine 0.94,   PMH: 1. HTN 2. Melanoma s/p surgery in 8/12 with groin lymph node dissection.  This resulted in right leg lymphedema. 3. Lymphedema in right leg.  4. Stress test normal in 2006 or 2007.  SH: Lives in [place] alone.  Has daughter in [place].  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  Nonsmoker.    FH: Grandfather with MI at 83  ROS: All systems reviewed and negative except as per HPI.   Current Outpatient Prescriptions  Medication Sig Dispense Refill  . Cholecalciferol (VITAMIN D-3 PO) Take 1,000 Units by mouth daily.       . hydrochlorothiazide (HYDRODIURIL) 25 MG tablet Take 1/2 tab by mouth every morning to lower Blood Pressure.  30 tablet  3  . Multiple Vitamin (MULTIVITAMIN) capsule Take 1 capsule by mouth daily.      . Multiple Vitamins-Minerals (ZINC PO) Take 500 mg by mouth daily.      Marland Kitchen DISCONTD: lisinopril (PRINIVIL,ZESTRIL) 5 MG tablet Take 1 tablet (5 mg total) by mouth  daily.  30 tablet  3  . aspirin EC 81 MG tablet Take 1 tablet (81 mg total) by mouth daily.      Marland Kitchen lisinopril (PRINIVIL,ZESTRIL) 10 MG tablet Take 1 tablet (10 mg total) by mouth daily.  90 tablet  3  . omeprazole (PRILOSEC) 40 MG capsule Take 40 mg by mouth as needed.      Marland Kitchen DISCONTD: atenolol-chlorthalidone (TENORETIC) 50-25 MG per tablet Take by mouth daily. 1/2 Tablet Q AM      . DISCONTD: omeprazole (PRILOSEC) 40 MG capsule Take 1 capsule (40 mg total) by mouth daily.  30 capsule  0    BP 140/90  Pulse 67  Ht 5' 8.75" (1.746 m)  Wt 246 lb (111.585 kg)  BMI 36.59 kg/m2  SpO2 97% General: NAD Neck: Thick, JVP 7 cm, no thyromegaly or thyroid nodule.  Lungs: Clear to auscultation bilaterally with normal respiratory effort. CV: Nondisplaced PMI.  Heart regular S1/S2, no S3/S4, no murmur.  1+ left ankle edema, right leg is in boot for lymphedema.  No carotid bruit.  Normal pedal pulses.  Abdomen: Soft, nontender, no hepatosplenomegaly, no distention.  Skin: Intact without lesions or rashes.  Neurologic: Alert and oriented x 3.  Psych: Normal affect. Extremities: Right leg in boot for lymphedema HEENT:  Normal.   Assessment/Plan  1. Dyspnea: I am uncertain of the cause of Mr Espinoza's dyspneic episode back in 7/13.  He had not had a similar episode prior and he has not had an episode since that time.  It is possible that it could be related to OSA.   He does snore loudly and has some daytime sleepiness.  He does not appear volume overloaded on exam.  He denies exertional dyspnea or chest pain currently.  - I will arrange for a sleep study to assess for OSA.  - I will get an echocardiogram and a BNP.  2. HTN: BP has been running high.  I will have him increase lisinopril to 10 mg daily.  He will return to the office in 2 wks and will bring his BP readings with him (will check daily at home).  BMET at followup.  3. I will have him start ASA 81 mg daily.   Marca Ancona 02/12/2012

## 2012-02-12 NOTE — Patient Instructions (Addendum)
Increase lisinopril to 10mg  daily. You can take two 5mg  tablets at the same time and use your current supply.  Start aspirin 81mg  daily. Take this with food.   Your physician has requested that you have an echocardiogram. Echocardiography is a painless test that uses sound waves to create images of your heart. It provides your doctor with information about the size and shape of your heart and how well your heart's chambers and valves are working. This procedure takes approximately one hour. There are no restrictions for this procedure.  Schedule an appointment for a sleep study.  Your physician recommends that you have lab work today--BNP  Your physician recommends that you schedule a follow-up appointment in: 2 weeks with Dr Shirlee Latch.   Your physician recommends that you return for lab work in: 2 weeks when you see Dr Frutoso Chase  Take and record your blood pressure daily. Bring the readings to your appointment with Dr Shirlee Latch in 2 weeks.

## 2012-02-19 ENCOUNTER — Ambulatory Visit (HOSPITAL_COMMUNITY): Payer: Self-pay | Attending: Cardiovascular Disease | Admitting: Radiology

## 2012-02-19 DIAGNOSIS — R0602 Shortness of breath: Secondary | ICD-10-CM

## 2012-02-19 DIAGNOSIS — I379 Nonrheumatic pulmonary valve disorder, unspecified: Secondary | ICD-10-CM | POA: Insufficient documentation

## 2012-02-19 DIAGNOSIS — I10 Essential (primary) hypertension: Secondary | ICD-10-CM | POA: Insufficient documentation

## 2012-02-19 DIAGNOSIS — I369 Nonrheumatic tricuspid valve disorder, unspecified: Secondary | ICD-10-CM | POA: Insufficient documentation

## 2012-02-19 NOTE — Progress Notes (Signed)
Echocardiogram performed.  

## 2012-02-21 ENCOUNTER — Ambulatory Visit: Payer: Self-pay | Admitting: Family Medicine

## 2012-02-27 ENCOUNTER — Ambulatory Visit: Payer: Self-pay | Admitting: Family Medicine

## 2012-02-28 ENCOUNTER — Encounter: Payer: Self-pay | Admitting: Cardiology

## 2012-02-28 ENCOUNTER — Other Ambulatory Visit: Payer: Self-pay

## 2012-02-28 ENCOUNTER — Other Ambulatory Visit (INDEPENDENT_AMBULATORY_CARE_PROVIDER_SITE_OTHER): Payer: Self-pay

## 2012-02-28 ENCOUNTER — Ambulatory Visit (INDEPENDENT_AMBULATORY_CARE_PROVIDER_SITE_OTHER): Payer: Self-pay | Admitting: Cardiology

## 2012-02-28 VITALS — BP 120/78 | HR 80 | Ht 68.0 in | Wt 241.0 lb

## 2012-02-28 DIAGNOSIS — R0602 Shortness of breath: Secondary | ICD-10-CM

## 2012-02-28 DIAGNOSIS — R0989 Other specified symptoms and signs involving the circulatory and respiratory systems: Secondary | ICD-10-CM

## 2012-02-28 DIAGNOSIS — I10 Essential (primary) hypertension: Secondary | ICD-10-CM

## 2012-02-28 MED ORDER — LISINOPRIL 20 MG PO TABS: 20.0000 mg | ORAL_TABLET | Freq: Every day | ORAL | Status: DC

## 2012-02-28 MED ORDER — HYDROCHLOROTHIAZIDE 25 MG PO TABS: ORAL_TABLET | ORAL | Status: DC

## 2012-02-28 NOTE — Patient Instructions (Addendum)
Increase lisinopril to 20mg  daily. You can take two 10mg  tablets and use your current supply.  Your physician recommends that you have  lab work today--BMET.   You do not need  schedule a follow-up appointment with Dr Shirlee Latch.  I will call you with the results of the sleep study after you have that done. I may take 1-3 weeks to get the results.

## 2012-02-29 LAB — BASIC METABOLIC PANEL
BUN: 13 mg/dL (ref 6–23)
CO2: 29 mEq/L (ref 19–32)
Calcium: 8.8 mg/dL (ref 8.4–10.5)
Creatinine, Ser: 0.9 mg/dL (ref 0.4–1.5)
Glucose, Bld: 118 mg/dL — ABNORMAL HIGH (ref 70–99)

## 2012-03-01 NOTE — Progress Notes (Signed)
Patient ID: Mark [name], male   DOB: 18-Mar-1951, 61 y.o.   MRN: [id number] PCP: Dr. Audria Nine  61 yo with history of HTN and melanoma returns for cardiology evaluation.  He has had right leg lymphedema since groin lymph node dissection for melanoma treatment.  He wears a boot to his thigh to try to control the lymphedema.  One day in 7/13, he woke up with dyspnea at around 2 am.  This lasted for around 2 hrs then resolved spontaneously.  He had trouble swallowing during the episode.  No chest pain.  Since that time, he has had no recurrence of the unusual dyspnea.  Prior to the episode, he had had no problems.  At baseline, he walks for exercise and can walk up steps without dyspnea or chest pain.  BP is doing better with med adjustment at last appointment but still in the 130s-140s systolic at home.  Weight is down 5 lbs.  I had him do an echo which showed EF 55-60%, no significant valvular abnormalities.    Labs (2/13): LDL 104, HDL 24 Labs (7/13): K 3.9, creatinine 0.94 Labs (9/13): BNP 22  PMH: 1. HTN 2. Melanoma s/p surgery in 8/12 with groin lymph node dissection.  This resulted in right leg lymphedema. 3. Lymphedema in right leg.  4. Stress test normal in 2006 or 2007. 5. Echo (9/13): EF 55-60%, mildly dilated LV, no significant valvular abnormalities.   SH: Lives in [place] alone.  Has daughter in [place].  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  Nonsmoker.    FH: Grandfather with MI at 25  ROS: All systems reviewed and negative except as per HPI.   Current Outpatient Prescriptions  Medication Sig Dispense Refill  . aspirin EC 81 MG tablet Take 1 tablet (81 mg total) by mouth daily.      . Cholecalciferol (VITAMIN D-3 PO) Take 1,000 Units by mouth daily.       . hydrochlorothiazide (HYDRODIURIL) 25 MG tablet Take 1/2 tab by mouth every morning to lower Blood Pressure.  30 tablet  1  . Multiple Vitamin (MULTIVITAMIN) capsule Take 1 capsule by mouth  daily.      . Multiple Vitamins-Minerals (ZINC PO) Take 500 mg by mouth daily.      Marland Kitchen omeprazole (PRILOSEC) 40 MG capsule Take 40 mg by mouth as needed.      Marland Kitchen lisinopril (PRINIVIL,ZESTRIL) 20 MG tablet Take 1 tablet (20 mg total) by mouth daily.  30 tablet  3  . DISCONTD: atenolol-chlorthalidone (TENORETIC) 50-25 MG per tablet Take by mouth daily. 1/2 Tablet Q AM        BP 120/78  Pulse 80  Ht 5\' 8"  (1.727 m)  Wt 241 lb (109.317 kg)  BMI 36.64 kg/m2 General: NAD Neck: Thick, JVP 7 cm, no thyromegaly or thyroid nodule.  Lungs: Clear to auscultation bilaterally with normal respiratory effort. CV: Nondisplaced PMI.  Heart regular S1/S2, no S3/S4, no murmur.  1+ left ankle edema, right leg is in boot for lymphedema.  No carotid bruit.  Normal pedal pulses.  Abdomen: Soft, nontender, no hepatosplenomegaly, no distention.  Skin: Intact without lesions or rashes.  Neurologic: Alert and oriented x 3.  Psych: Normal affect. Extremities: Right leg in boot for lymphedema HEENT: Normal.   Assessment/Plan  1. Dyspnea: I am uncertain of the cause of [name]'s dyspneic episode back in 7/13.  He had not had a similar episode prior and he has not had  an episode since that time.  It is possible that it could be related to OSA.   He does snore loudly and has some daytime sleepiness.  He does not appear volume overloaded on exam.  He denies exertional dyspnea or chest pain currently.  Echo and BNP were unremarkable.  He has not yet had his sleep study.  No further workup beyond the sleep study unless he has recurrent symptoms.  2. HTN: BP still running high at times.  I will have him increase lisinopril to 20 mg daily.  BMET today.   Marca Ancona 9/59/2013

## 2012-03-03 ENCOUNTER — Telehealth: Payer: Self-pay | Admitting: Cardiology

## 2012-03-03 NOTE — Telephone Encounter (Signed)
Pt.notified

## 2012-03-03 NOTE — Telephone Encounter (Signed)
Patient returning nurse call, he can be reached at [phone]

## 2012-03-05 ENCOUNTER — Ambulatory Visit (HOSPITAL_BASED_OUTPATIENT_CLINIC_OR_DEPARTMENT_OTHER): Payer: Self-pay | Attending: Cardiology

## 2012-03-05 VITALS — Ht 68.0 in | Wt 241.0 lb

## 2012-03-05 DIAGNOSIS — R0683 Snoring: Secondary | ICD-10-CM

## 2012-03-05 DIAGNOSIS — G4733 Obstructive sleep apnea (adult) (pediatric): Secondary | ICD-10-CM | POA: Insufficient documentation

## 2012-03-08 DIAGNOSIS — G4710 Hypersomnia, unspecified: Secondary | ICD-10-CM

## 2012-03-08 DIAGNOSIS — G4730 Sleep apnea, unspecified: Secondary | ICD-10-CM

## 2012-03-08 NOTE — Procedures (Signed)
NAMETYAN, MCCLENTON             ACCOUNT NO.:  [id number]  MEDICAL RECORD NO.:  [id number]          PATIENT TYPE:  OUT  LOCATION:  SLEEP CENTER                 FACILITY:  [facility]  PHYSICIAN:  Barbaraann Share, MD,FCCPDATE OF BIRTH:  02/04/1953  DATE OF STUDY:  03/05/2012                           NOCTURNAL POLYSOMNOGRAM  REFERRING PHYSICIAN:  Marca Ancona, MD  INDICATION FOR THE STUDY:  Hypersomnia with sleep apnea.  EPWORTH SLEEPINESS SCORE:  23.  SLEEP ARCHITECTURE:  The patient had a total sleep time of 254 minutes with no slow-wave sleep and only 36 minutes of REM.  Sleep onset latency was normal at 29 minutes and sleep efficiency was poor during the diagnostic and titration portion of the study.  RESPIRATORY DATA:  The patient underwent a split night protocol where he was found to have 72 obstructive and central events in the first 125 minutes of sleep.  This gave him an apnea/hypopnea index during the diagnostic portion of 35 events per hour.  The events occur primarily in the supine position and there was moderate snoring noted throughout.  By protocol, the patient was fitted with a large Respironics Wisp nasal mask, and CPAP titration was initiated.  He was found to have good control of his obstructive events and snoring even during supine REM with a CPAP pressure of 7 cm of water.  OXYGEN DATA:  There were O2 desaturations as low as 85% with the patient's obstructive events.  The patient also had desaturations to 88% or less despite optimal CPAP therapy.  CARDIAC DATA:  No clinically significant arrhythmias were noted.  MOVEMENT/PARASOMNIA:  There were no significant leg jerks or other abnormal behaviors seen.  IMPRESSION/RECOMMENDATION:  Moderate obstructive sleep apnea/hypopnea syndrome with an AHI of 35 events per hour and O2 desaturation as low as 85% during the diagnostic portion of the study.  The patient was then fitted with a large Respironics Wisp nasal  mask, and found to have an optimal CPAP pressure of 7 cm of water.  It should be noted he continued to have occasional oxygen desaturation to 88% or less despite being on optimal CPAP therapy.  Would therefore recommend doing an overnight oximetry once on optimal CPAP to make sure he does not require concomitant oxygen.  Would also encourage the patient to work aggressively on weight loss.     Barbaraann Share, MD,FCCP Diplomate, American Board of Sleep Medicine    [facility]/MEDQ  D:  03/08/2012 15:33:07  T:  03/08/2012 20:59:42  Job:  [id number]

## 2012-03-12 ENCOUNTER — Other Ambulatory Visit: Payer: Self-pay | Admitting: *Deleted

## 2012-03-12 ENCOUNTER — Telehealth: Payer: Self-pay | Admitting: *Deleted

## 2012-03-12 DIAGNOSIS — G4730 Sleep apnea, unspecified: Secondary | ICD-10-CM

## 2012-03-12 NOTE — Telephone Encounter (Signed)
To: Laurey Morale, MD Will arrange for consult if you can have your nurse put the referral in the computer. Thanks. ----- Message ----- From: Laurey Morale, MD Sent: 03/10/2012 11:33 PM To: Barbaraann Share, MD, Jacqlyn Krauss, RN Would be great if you could see him. ----- Message ----- From: Barbaraann Share, MD Sent: 03/10/2012 6:00 PM To: Laurey Morale, MD Wanted to let you know this pts sleep study shows moderate osa with AHI 35/hr. See report for further details. Let us know if we can help.   03/12/12--pt notified

## 2012-03-13 ENCOUNTER — Institutional Professional Consult (permissible substitution): Payer: Self-pay | Admitting: Pulmonary Disease

## 2012-03-18 ENCOUNTER — Ambulatory Visit: Payer: Self-pay | Admitting: Family Medicine

## 2012-03-18 ENCOUNTER — Encounter: Payer: Self-pay | Admitting: Family Medicine

## 2012-03-18 VITALS — BP 143/90 | HR 72 | Temp 98.3°F | Resp 18 | Ht 68.0 in | Wt 246.0 lb

## 2012-03-18 DIAGNOSIS — J309 Allergic rhinitis, unspecified: Secondary | ICD-10-CM

## 2012-03-18 MED ORDER — CETIRIZINE HCL 10 MG PO TABS: 10.0000 mg | ORAL_TABLET | Freq: Every day | ORAL | Status: DC

## 2012-03-18 NOTE — Patient Instructions (Signed)

## 2012-03-19 ENCOUNTER — Encounter: Payer: Self-pay | Admitting: Family Medicine

## 2012-03-19 NOTE — Progress Notes (Signed)
S:  61 y.o. Cauc male returns for HTN follow-up and to report that he is in process of Sleep Apnea evaluation; he is scheduled to see Pulmonary specialist (Dr. Shelle Iron). He was referred to Dr. Shelle Iron by his cardiologist (Dr. Shirlee Latch). Pt feels well and is staying active. He is maintaining better nutrition. He denies diaphoresis, fatigue, fever/chills, unexpected weight change, CP or tightness, palpitations, SOB or cough, HA, dizziness or lightheadedness, weakness or numbness or syncope. He does still have chronic lower extremity edema.   He declines the Flu vaccine today. He has chronic puffiness under his eyes; his daughter reminded him that he used to take antihistamine which would be helpful for seasonal allergy symptoms. He denies rhinorrhea or sneezing.  ROS: As per HPI.  O: In NAD; WN,WD. HENT: [place]/AT. EOMI with clear conj/sck. COR: RRR. 1+ pretibial edema. LUNGS: Normal resp rate; unlabored. SKIN: W&D. No pallor. NEURO: A&O x 3; CNs intact. Otherwise nonfocal.  A/P: 1. Allergic rhinitis         RF: Cetirizine 10 mg 1 tab every PM  Follow-up as scheduled with Specialist to discuss treatment of sleep apnea.

## 2012-04-10 ENCOUNTER — Encounter: Payer: Self-pay | Admitting: Pulmonary Disease

## 2012-04-10 ENCOUNTER — Ambulatory Visit (INDEPENDENT_AMBULATORY_CARE_PROVIDER_SITE_OTHER): Payer: Self-pay | Admitting: Pulmonary Disease

## 2012-04-10 VITALS — BP 126/82 | HR 68 | Temp 98.0°F | Ht 68.0 in | Wt 250.0 lb

## 2012-04-10 DIAGNOSIS — Z23 Encounter for immunization: Secondary | ICD-10-CM

## 2012-04-10 DIAGNOSIS — G4733 Obstructive sleep apnea (adult) (pediatric): Secondary | ICD-10-CM

## 2012-04-10 NOTE — Progress Notes (Signed)
  Subjective:    Patient ID: Mark [name], male    DOB: 01-27-1951, 61 y.o.   MRN: [id number]  HPI The patient is a 61 year old male who I've been asked to see for management of obstructive sleep apnea.  The patient tells me that he was diagnosed with significant sleep apnea 25 years ago, and was on a CPAP machine for a period of time.  He has not worn one in years, and recently underwent repeat testing that showed an AHI of 35 events per hour.  He was titrated to 7-8 cm of water pressure with adequate control at that time.  The patient has been noted to have snoring, but no one has mentioned pauses in his breathing during sleep.  He has frequent awakenings at night, and has not rested in the mornings upon arising.  He notes significant sleep pressure during the day, and will nap almost every day.  He also has sleepiness with driving at times.  His weight is up 30 pounds over the last 2 years, and his Epworth score today is 19  Sleep Questionnaire: What time do you typically go to bed?( Between what hours) 11:00pm How long does it take you to fall asleep? 2-30 minutes How many times during the night do you wake up? 5 What time do you get out of bed to start your day? 4098 Do you drive or operate heavy machinery in your occupation? No How much has your weight changed (up or down) over the past two years? (In pounds) 30 lb (13.608 kg) Have you ever had a sleep study before? Yes If yes, location of study? [facility] If yes, date of study? 03/2012 Do you currently use CPAP? No Do you wear oxygen at any time? No    Review of Systems  Constitutional: Negative for fever and unexpected weight change.  HENT: Negative for ear pain, nosebleeds, congestion, sore throat, rhinorrhea, sneezing, trouble swallowing, dental problem, postnasal drip and sinus pressure.   Eyes: Negative for redness and itching.  Respiratory: Negative for cough, chest tightness, shortness of breath and wheezing.   Cardiovascular:  Positive for leg swelling. Negative for palpitations.  Gastrointestinal: Negative for nausea and vomiting.  Genitourinary: Negative for dysuria.  Musculoskeletal: Positive for joint swelling.  Skin: Negative for rash.  Neurological: Negative for headaches.  Hematological: Does not bruise/bleed easily.  Psychiatric/Behavioral: Negative for dysphoric mood. The patient is not nervous/anxious.        Objective:   Physical Exam Constitutional:  Obese male, no acute distress  HENT:  Nares patent without discharge, but narrowed bilat.   Oropharynx without exudate, palate and uvula are elongated.  Eyes:  Perrla, eomi, no scleral icterus  Neck:  No JVD, no TMG  Cardiovascular:  Normal rate, regular rhythm, no rubs or gallops.  No murmurs        Intact distal pulses  Pulmonary :  Normal breath sounds, no stridor or respiratory distress   No rales, rhonchi, or wheezing  Abdominal:  Soft, nondistended, bowel sounds present.  No tenderness noted.   Musculoskeletal:  3+ lower extremity edema noted.  Lymph Nodes:  No cervical lymphadenopathy noted  Skin:  No cyanosis noted  Neurologic:  Alert, appropriate, moves all 4 extremities without obvious deficit.         Assessment & Plan:

## 2012-04-10 NOTE — Patient Instructions (Addendum)
Will start you back on cpap.  Please call if tolerance issues. Work on weight loss followup with me in 6mos.

## 2012-04-10 NOTE — Assessment & Plan Note (Signed)
The patient has a long-standing history of obstructive sleep apnea, and has been on CPAP in the past.  He has had a recent sleep study to verify moderate to severe obstructive sleep apnea, and obviously needs to get back on CPAP treatment.  I will start the patient on a pressure of 8 cm, but I would find that very unlikely to be optimal treatment for his degree of sleep apnea.  We can adjust her pressure at a later date on the automatic setting.  I've also encouraged him to work aggressively on weight loss.

## 2012-04-15 ENCOUNTER — Telehealth: Payer: Self-pay | Admitting: Emergency Medicine

## 2012-04-15 ENCOUNTER — Encounter: Payer: Self-pay | Admitting: Emergency Medicine

## 2012-04-15 NOTE — Telephone Encounter (Signed)
Called Aynor, left message for him.

## 2012-04-15 NOTE — Telephone Encounter (Signed)
Please call patient and ask him to come in and see me either tomorrow or Thursday so I can evaluate the swelling in his leg. I will be here in the mornings between 745 and 2.

## 2012-04-15 NOTE — Telephone Encounter (Signed)
Pt returning Amy L phone call.  [phone]

## 2012-04-15 NOTE — Telephone Encounter (Signed)
Mark [name] he plans to be here tomorrow am at 745

## 2012-04-16 ENCOUNTER — Other Ambulatory Visit: Payer: Self-pay | Admitting: Family Medicine

## 2012-04-16 ENCOUNTER — Ambulatory Visit (INDEPENDENT_AMBULATORY_CARE_PROVIDER_SITE_OTHER): Payer: Self-pay | Admitting: Emergency Medicine

## 2012-04-16 ENCOUNTER — Ambulatory Visit: Payer: Self-pay

## 2012-04-16 ENCOUNTER — Encounter: Payer: Self-pay | Admitting: Family Medicine

## 2012-04-16 ENCOUNTER — Encounter (INDEPENDENT_AMBULATORY_CARE_PROVIDER_SITE_OTHER): Payer: Self-pay

## 2012-04-16 ENCOUNTER — Telehealth: Payer: Self-pay | Admitting: *Deleted

## 2012-04-16 VITALS — BP 129/78 | HR 71 | Temp 97.6°F | Resp 16 | Ht 68.0 in | Wt 249.0 lb

## 2012-04-16 DIAGNOSIS — R609 Edema, unspecified: Secondary | ICD-10-CM

## 2012-04-16 DIAGNOSIS — I890 Lymphedema, not elsewhere classified: Secondary | ICD-10-CM | POA: Insufficient documentation

## 2012-04-16 DIAGNOSIS — M79609 Pain in unspecified limb: Secondary | ICD-10-CM

## 2012-04-16 DIAGNOSIS — M79604 Pain in right leg: Secondary | ICD-10-CM

## 2012-04-16 DIAGNOSIS — M7989 Other specified soft tissue disorders: Secondary | ICD-10-CM

## 2012-04-16 MED ORDER — OMEGA-3 FATTY ACIDS 1000 MG PO CAPS: ORAL_CAPSULE | ORAL | Status: DC

## 2012-04-16 NOTE — Progress Notes (Signed)
  Subjective:    Patient ID: Mark [name], male    DOB: 06/28/1950, 61 y.o.   MRN: [id number]  HPI patient has a complicated history in that last year he was diagnosed with metastatic melanoma. He had surgery for this problem with removal of lymph nodes in the groin and in his right axilla. Since that time he has been bothered with significant swelling in the right leg. The last 3 weeks this has become progressively worse with increasing pain and swelling of the right leg . He also has discomfort in the medial portion of the right knee    Review of Systems     Objective:   Physical Exam there is significant swelling of the right leg. There is a 2-1/2 inch difference in the right calf compared to the left calf and right thigh compared to the left thigh. There is mild tenderness over the medial portion of the knee.    [facility] reading (PRIMARY) by  Dr.Adonia Porada no fracture seen no bony lesions seen      Assessment & Plan:  Plan for patient to go to the [facility] cardiology at 3:30 to have Doppler study of the right leg. This may all be lymphedema related to his previous melanoma surgery.

## 2012-04-17 NOTE — Addendum Note (Signed)
Addended byCaffie Damme on: 04/17/2012 01:41 PM   Modules accepted: Orders

## 2012-05-01 ENCOUNTER — Encounter: Payer: Self-pay | Admitting: Cardiology

## 2012-05-01 ENCOUNTER — Encounter: Payer: Self-pay | Admitting: Family Medicine

## 2012-05-22 ENCOUNTER — Ambulatory Visit: Payer: Self-pay | Admitting: Pulmonary Disease

## 2012-05-23 ENCOUNTER — Telehealth: Payer: Self-pay | Admitting: Pulmonary Disease

## 2012-05-23 NOTE — Telephone Encounter (Signed)
Will forward to Kc AS AN fyi

## 2012-05-23 NOTE — Telephone Encounter (Signed)
We need to make sure that his f/u visit with me is 5-6 weeks after he starts cpap then.

## 2012-05-23 NOTE — Telephone Encounter (Signed)
Pt aware and will call to make appt once set up. Nothing further was needed

## 2012-06-09 ENCOUNTER — Ambulatory Visit: Payer: Self-pay | Admitting: Pulmonary Disease

## 2012-06-16 ENCOUNTER — Telehealth: Payer: Self-pay | Admitting: Pulmonary Disease

## 2012-06-16 NOTE — Telephone Encounter (Signed)
I would not make an effort to reschedule him at this point, and let him decide when he is going to return.  If he schedules and cancels one more visit, will be discharged from the practice.

## 2012-06-16 NOTE — Telephone Encounter (Signed)
Patient was last seen 04/10/12 with [facility] and has cx'd multiple appts since November. Patient cx'd 05/12/12 and 06/09/12 appts to follow up. Patient has also been unreachable to and has not been set up on CPAP as of yet per [facility] after 3 attempts to call and have set up.  Paperwork has been placed in Junell Cullifer folder to review.  Dr Shelle Iron, please advise.

## 2012-07-10 ENCOUNTER — Other Ambulatory Visit: Payer: Self-pay | Admitting: Cardiology

## 2012-07-10 ENCOUNTER — Other Ambulatory Visit: Payer: Self-pay | Admitting: Family Medicine

## 2012-07-10 ENCOUNTER — Encounter: Payer: Self-pay | Admitting: Family Medicine

## 2012-07-10 MED ORDER — HYDROCHLOROTHIAZIDE 25 MG PO TABS: ORAL_TABLET | ORAL | Status: DC

## 2012-07-22 ENCOUNTER — Ambulatory Visit: Payer: Self-pay | Admitting: Family Medicine

## 2012-08-08 ENCOUNTER — Ambulatory Visit: Payer: Self-pay | Admitting: Family Medicine

## 2012-08-29 ENCOUNTER — Ambulatory Visit: Payer: Self-pay | Admitting: Family Medicine

## 2012-09-11 ENCOUNTER — Encounter: Payer: Self-pay | Admitting: Family Medicine

## 2012-09-12 NOTE — Progress Notes (Signed)
This encounter was created in error - please disregard.

## 2012-11-12 ENCOUNTER — Encounter: Payer: Self-pay | Admitting: Family Medicine

## 2012-11-26 ENCOUNTER — Other Ambulatory Visit: Payer: Self-pay | Admitting: Cardiology

## 2012-11-26 ENCOUNTER — Encounter: Payer: Self-pay | Admitting: Cardiology

## 2012-11-27 ENCOUNTER — Encounter: Payer: Self-pay | Admitting: Family Medicine

## 2012-11-27 ENCOUNTER — Other Ambulatory Visit: Payer: Self-pay | Admitting: *Deleted

## 2012-11-27 DIAGNOSIS — I10 Essential (primary) hypertension: Secondary | ICD-10-CM

## 2012-11-27 MED ORDER — LISINOPRIL 20 MG PO TABS: 20.0000 mg | ORAL_TABLET | Freq: Every day | ORAL | Status: DC

## 2013-01-02 ENCOUNTER — Encounter: Payer: Self-pay | Admitting: Family Medicine

## 2013-02-24 ENCOUNTER — Encounter: Payer: Self-pay | Admitting: Family Medicine

## 2013-03-19 ENCOUNTER — Encounter: Payer: Self-pay | Admitting: Family Medicine

## 2013-03-19 ENCOUNTER — Ambulatory Visit: Payer: Self-pay | Admitting: Family Medicine

## 2013-03-19 VITALS — BP 138/92 | HR 71 | Temp 98.3°F | Resp 16 | Ht 67.5 in | Wt 242.0 lb

## 2013-03-19 DIAGNOSIS — Z0000 Encounter for general adult medical examination without abnormal findings: Secondary | ICD-10-CM

## 2013-03-19 DIAGNOSIS — R0609 Other forms of dyspnea: Secondary | ICD-10-CM

## 2013-03-19 DIAGNOSIS — Z125 Encounter for screening for malignant neoplasm of prostate: Secondary | ICD-10-CM

## 2013-03-19 DIAGNOSIS — R0600 Dyspnea, unspecified: Secondary | ICD-10-CM

## 2013-03-19 DIAGNOSIS — I890 Lymphedema, not elsewhere classified: Secondary | ICD-10-CM

## 2013-03-19 DIAGNOSIS — Z23 Encounter for immunization: Secondary | ICD-10-CM

## 2013-03-19 DIAGNOSIS — I10 Essential (primary) hypertension: Secondary | ICD-10-CM

## 2013-03-19 LAB — POCT URINALYSIS DIPSTICK
Bilirubin, UA: NEGATIVE
Ketones, UA: NEGATIVE
Leukocytes, UA: NEGATIVE
Protein, UA: NEGATIVE
Spec Grav, UA: >=1.03
pH, UA: 6

## 2013-03-19 LAB — COMPREHENSIVE METABOLIC PANEL
ALT: 16 U/L (ref 0–53)
AST: 17 U/L (ref 0–37)
Albumin: 4.1 g/dL (ref 3.5–5.2)
Alkaline Phosphatase: 76 U/L (ref 39–117)
Creat: 0.95 mg/dL (ref 0.50–1.35)
Glucose, Bld: 97 mg/dL (ref 70–99)
Potassium: 3.7 mEq/L (ref 3.5–5.3)
Sodium: 139 mEq/L (ref 135–145)
Total Bilirubin: 0.6 mg/dL (ref 0.3–1.2)

## 2013-03-19 LAB — LIPID PANEL
Cholesterol: 243 mg/dL — ABNORMAL HIGH (ref 0–200)
LDL Cholesterol: 173 mg/dL — ABNORMAL HIGH (ref 0–99)
Total CHOL/HDL Ratio: 6.6 Ratio
VLDL: 33 mg/dL (ref 0–40)

## 2013-03-19 LAB — IFOBT (OCCULT BLOOD): IFOBT: NEGATIVE

## 2013-03-19 MED ORDER — HYDROCHLOROTHIAZIDE 25 MG PO TABS: ORAL_TABLET | ORAL | Status: DC

## 2013-03-19 NOTE — Progress Notes (Signed)
Subjective:    Patient ID: Mark [name], male    DOB: [birth year], 62 y.o.   MRN: [id number]  HPI  This 62 y.o. Cauc male is here for CPE. He has stable HTN which is also monitored by Dr. Shirlee Latch (last visit Sept 2013). Pt is s/p treatment for melanoma; he has follow-up at [facility] in November.   Pt reports an episode 1 month ago where he had dizziness, nausea and vomiting after eating a tuna fish sandwich. He felt better after vomiting but still has intermittent SOB/ DOE. He has chronic R leg edema; his melanoma surgery involved R thorax and pelvis for nodes resections. He does not report orthopnea, prod cough or wheezing. The pt has been seen by Dr. Shelle Iron for OSA and CPAP use.  Pt's has major concern re: newborn grandson who underwent heart transplant for Left Heart Hypoplastic Syndrome. He was born in July and is at [facility]; he is on the ventilator, with recent attempts at weaning unsuccessful.  HCM: CRS- 2004 per pt (normal).           IMM- pt declines Flu vaccine and Pneumovax.   Review of Systems  Constitutional: Positive for unexpected weight change.  Eyes: Negative.   Respiratory: Positive for cough, chest tightness, shortness of breath and stridor.   Cardiovascular: Positive for leg swelling.       Right leg  Gastrointestinal: Negative.   Endocrine: Negative.   Genitourinary: Negative.   Musculoskeletal: Negative.   Skin: Negative.   Allergic/Immunologic: Negative.   Neurological: Negative.   Hematological: Negative.   Psychiatric/Behavioral: Negative.        Objective:   Physical Exam  Nursing note and vitals reviewed. Constitutional: He is oriented to person, place, and time. Vital signs are normal. He appears well-developed and well-nourished. No distress.  HENT:  Head: Normocephalic and atraumatic.  Right Ear: Hearing, external ear and ear canal normal. Tympanic membrane is scarred. Tympanic membrane is not injected, not perforated and not  erythematous.  Left Ear: External ear and ear canal normal. Tympanic membrane is scarred. Tympanic membrane is not injected, not perforated and not erythematous. Decreased hearing is noted.  Scant middle effusions bilaterally.  Eyes: Conjunctivae, EOM and lids are normal. Pupils are equal, round, and reactive to light. No scleral icterus.  Fundoscopic exam:      The right eye shows no arteriolar narrowing and no papilledema. The right eye shows red reflex.       The left eye shows no arteriolar narrowing and no papilledema. The left eye shows red reflex.  Neck: Normal range of motion and full passive range of motion without pain. No JVD present. No spinous process tenderness and no muscular tenderness present. Carotid bruit is not present. No mass and no thyromegaly present.  Cardiovascular: Normal rate, regular rhythm, S1 normal and S2 normal.   No extrasystoles are present. PMI is not displaced.  Exam reveals distant heart sounds. Exam reveals no gallop and no friction rub.   No murmur heard. Pulses:      Carotid pulses are 1+ on the right side, and 1+ on the left side.      Radial pulses are 1+ on the right side, and 1+ on the left side.       Femoral pulses are 1+ on the right side, and 1+ on the left side.      Popliteal pulses are 1+ on the right side, and 1+ on the left side.  Dorsalis pedis pulses are 1+ on the right side, and 1+ on the left side.       Posterior tibial pulses are 1+ on the right side, and 1+ on the left side.  Pulmonary/Chest: Effort normal and breath sounds normal. No respiratory distress.  Breath sounds distant.  Abdominal: Soft. Normal appearance. He exhibits no distension, no pulsatile midline mass and no mass. Bowel sounds are decreased. There is no hepatosplenomegaly. There is no tenderness. There is no guarding and no CVA tenderness. No hernia.  Intra-abdominal organs/masses obscured by adipose tissue.  Genitourinary: Rectum normal. Rectal exam shows no  fissure, no mass, no tenderness and anal tone normal. Guaiac negative stool. Prostate is not enlarged and not tender.  Musculoskeletal: He exhibits edema.       Right shoulder: Normal.       Left shoulder: Normal.       Right elbow: He exhibits decreased range of motion and deformity. No tenderness found.       Left elbow: Normal.       Right wrist: Normal.       Left wrist: Normal.       Right knee: He exhibits decreased range of motion. He exhibits no swelling, no effusion, no deformity and normal alignment. No tenderness found.       Left knee: He exhibits decreased range of motion. He exhibits no swelling, no effusion, no deformity and normal alignment.       Right ankle: He exhibits swelling. He exhibits no ecchymosis and no deformity. No tenderness.       Left ankle: He exhibits no swelling and no deformity. No tenderness.       Cervical back: Normal.       Thoracic back: Normal.       Lumbar back: Normal.       Right upper leg: Normal.       Left upper leg: Normal.       Right lower leg: He exhibits swelling and edema. He exhibits no tenderness and no deformity.       Left lower leg: Normal.  R lower ext- 2+ pitting edema.  Lymphadenopathy:       Head (right side): No submental, no submandibular, no tonsillar, no posterior auricular and no occipital adenopathy present.       Head (left side): No submental, no submandibular, no tonsillar, no posterior auricular and no occipital adenopathy present.    He has no cervical adenopathy.       Right: No inguinal and no supraclavicular adenopathy present.       Left: No inguinal and no supraclavicular adenopathy present.  Neurological: He is alert and oriented to person, place, and time. He has normal strength. He displays no atrophy and no tremor. No cranial nerve deficit or sensory deficit. He exhibits normal muscle tone. He displays a negative Romberg sign. Coordination and gait normal.  Reflex Scores:      Tricep reflexes are 1+ on the  right side and 1+ on the left side.      Bicep reflexes are 2+ on the right side and 2+ on the left side.      Patellar reflexes are 2+ on the right side and 2+ on the left side. Skin: Skin is warm, dry and intact. No ecchymosis, no lesion, no petechiae and no rash noted. He is not diaphoretic. No cyanosis or erythema. No pallor.  Psychiatric: He has a normal mood and affect. His speech is normal and behavior  is normal. Judgment and thought content normal. Cognition and memory are normal.    Results for orders placed in visit on 03/19/13  POCT URINALYSIS DIPSTICK      Result Value Range   Color, UA yellow     Clarity, UA clear     Glucose, UA neg     Bilirubin, UA neg     Ketones, UA neg     Spec Grav, UA >=1.030     Blood, UA neg     pH, UA 6.0     Protein, UA neg     Urobilinogen, UA 0.2     Nitrite, UA neg     Leukocytes, UA Negative    IFOBT (OCCULT BLOOD)      Result Value Range   IFOBT Negative         Assessment & Plan:  Routine general medical examination at a health care facility - Plan: POCT urinalysis dipstick, Comprehensive metabolic panel, Lipid panel, IFOBT POC (occult bld, rslt in office)  HTN (hypertension) - Stable on current medications but symptomatic w/ hx of pericardial effusion and OSA. ECHO- Sept 2013: LBEF 55-60%; pulmonic and tricuspid valves w/ trivial-mild regurgitation, normal aorta. Plan: Ambulatory referral to Cardiology  Dyspnea -  CXR July 2013: No acute abnormalities.   Plan: Ambulatory referral to Cardiology  Screening for prostate cancer - Plan: PSA  Lymphedema of leg- Due to lymph node resection in R groin 2 years ago for melanoma staging.  Need for prophylactic vaccination with combined diphtheria-tetanus-pertussis (DTP) vaccine - Plan: Tdap vaccine greater than or equal to 7yo IM    Meds ordered this encounter  Medications  . hydrochlorothiazide (HYDRODIURIL) 25 MG tablet    Sig: TAKE ONE-HALF TABLET BY MOUTH IN THE MORNING TO  LOWER BLOOD PRESSURE    Dispense:  90 tablet    Refill:  1

## 2013-03-19 NOTE — Patient Instructions (Signed)
Keeping you healthy  Get these tests  Blood pressure- Have your blood pressure checked once a year by your healthcare provider.  Normal blood pressure is 120/80  Weight- Have your body mass index (BMI) calculated to screen for obesity.  BMI is a measure of body fat based on height and weight. You can also calculate your own BMI at [email or web address].  Cholesterol- Have your cholesterol checked every year.  Diabetes- Have your blood sugar checked regularly if you have high blood pressure, high cholesterol, have a family history of diabetes or if you are overweight.  Screening for Colon Cancer- Colonoscopy starting at age 19.  Screening may begin sooner depending on your family history and other health conditions. Follow up colonoscopy as directed by your Gastroenterologist.  Screening for Prostate Cancer- Both blood work (PSA) and a rectal exam help screen for Prostate Cancer.  Screening begins at age 30 with African-American men and at age 50 with Caucasian men.  Screening may begin sooner depending on your family history.  Take these medicines  Aspirin- One aspirin daily can help prevent Heart disease and Stroke.  Flu shot- Every fall. You have declined this vaccine today. Please consult your specialist at [facility] about the importance of this vaccine.  Tetanus- Every 10 years. You received this vaccine today; next Tetanus is due in 10 years (2024).  Zostavax- Once after the age of 78 to prevent Shingles.  Pneumonia shot- Once after the age of 25; if you are younger than 49, ask your healthcare provider if you need a Pneumonia shot. Please talk to your specialist at [facility] about getting this vaccine before age 36 given your medical history.  Take these steps  Don't smoke- If you do smoke, talk to your doctor about quitting.  For tips on how to quit, go to www.smokefree.gov or call 1-800-QUIT-NOW.  Be physically active- Exercise 5 days a week for at  least 30 minutes.  If you are not already physically active start slow and gradually work up to 30 minutes of moderate physical activity.  Examples of moderate activity include walking briskly, mowing the yard, dancing, swimming, bicycling, etc.  Eat a healthy diet- Eat a variety of healthy food such as fruits, vegetables, low fat milk, low fat cheese, yogurt, lean meant, poultry, fish, beans, tofu, etc. For more information go to www.thenutritionsource.org  Drink alcohol in moderation- Limit alcohol intake to less than two drinks a day. Never drink and drive.  Dentist- Brush and floss twice daily; visit your dentist twice a year.  Depression- Your emotional health is as important as your physical health. If you're feeling down, or losing interest in things you would normally enjoy please talk to your healthcare provider.  Eye exam- Visit your eye doctor every year.  Safe sex- If you may be exposed to a sexually transmitted infection, use a condom.  Seat belts- Seat belts can save your life; always wear one.  Smoke/Carbon Monoxide detectors- These detectors need to be installed on the appropriate level of your home.  Replace batteries at least once a year.  Skin cancer- When out in the sun, cover up and use sunscreen 15 SPF or higher.  Violence- If anyone is threatening you, please tell your healthcare provider.  Living Will/ Health care power of attorney- Speak with your healthcare provider and family.

## 2013-03-20 LAB — PSA: PSA: 1.25 ng/mL (ref <=4.00)

## 2013-03-20 IMAGING — CR DG FEMUR 2+V*R*
2 series · 2 of 2 positions shown · non-contrast
Comparison: None.

CLINICAL DATA: Right leg pain and swelling

RIGHT FEMUR - 2 VIEW

[AP]
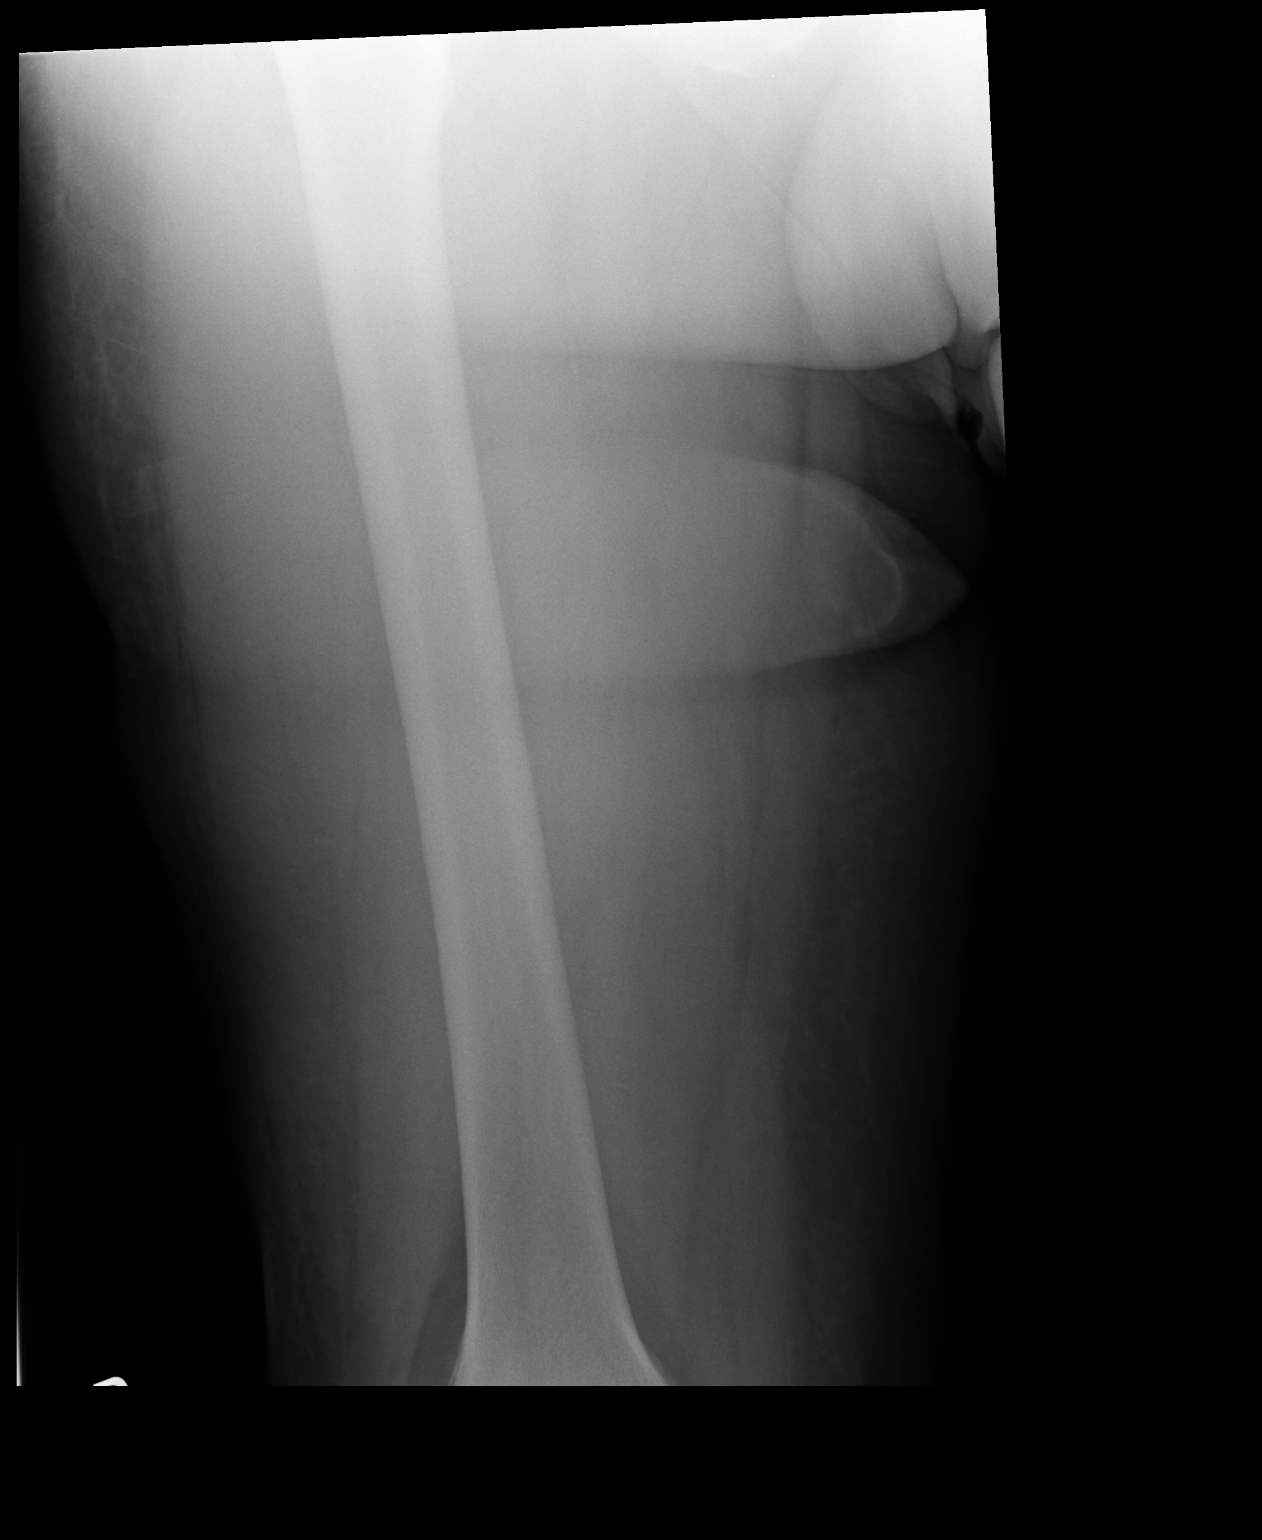

[lateral]
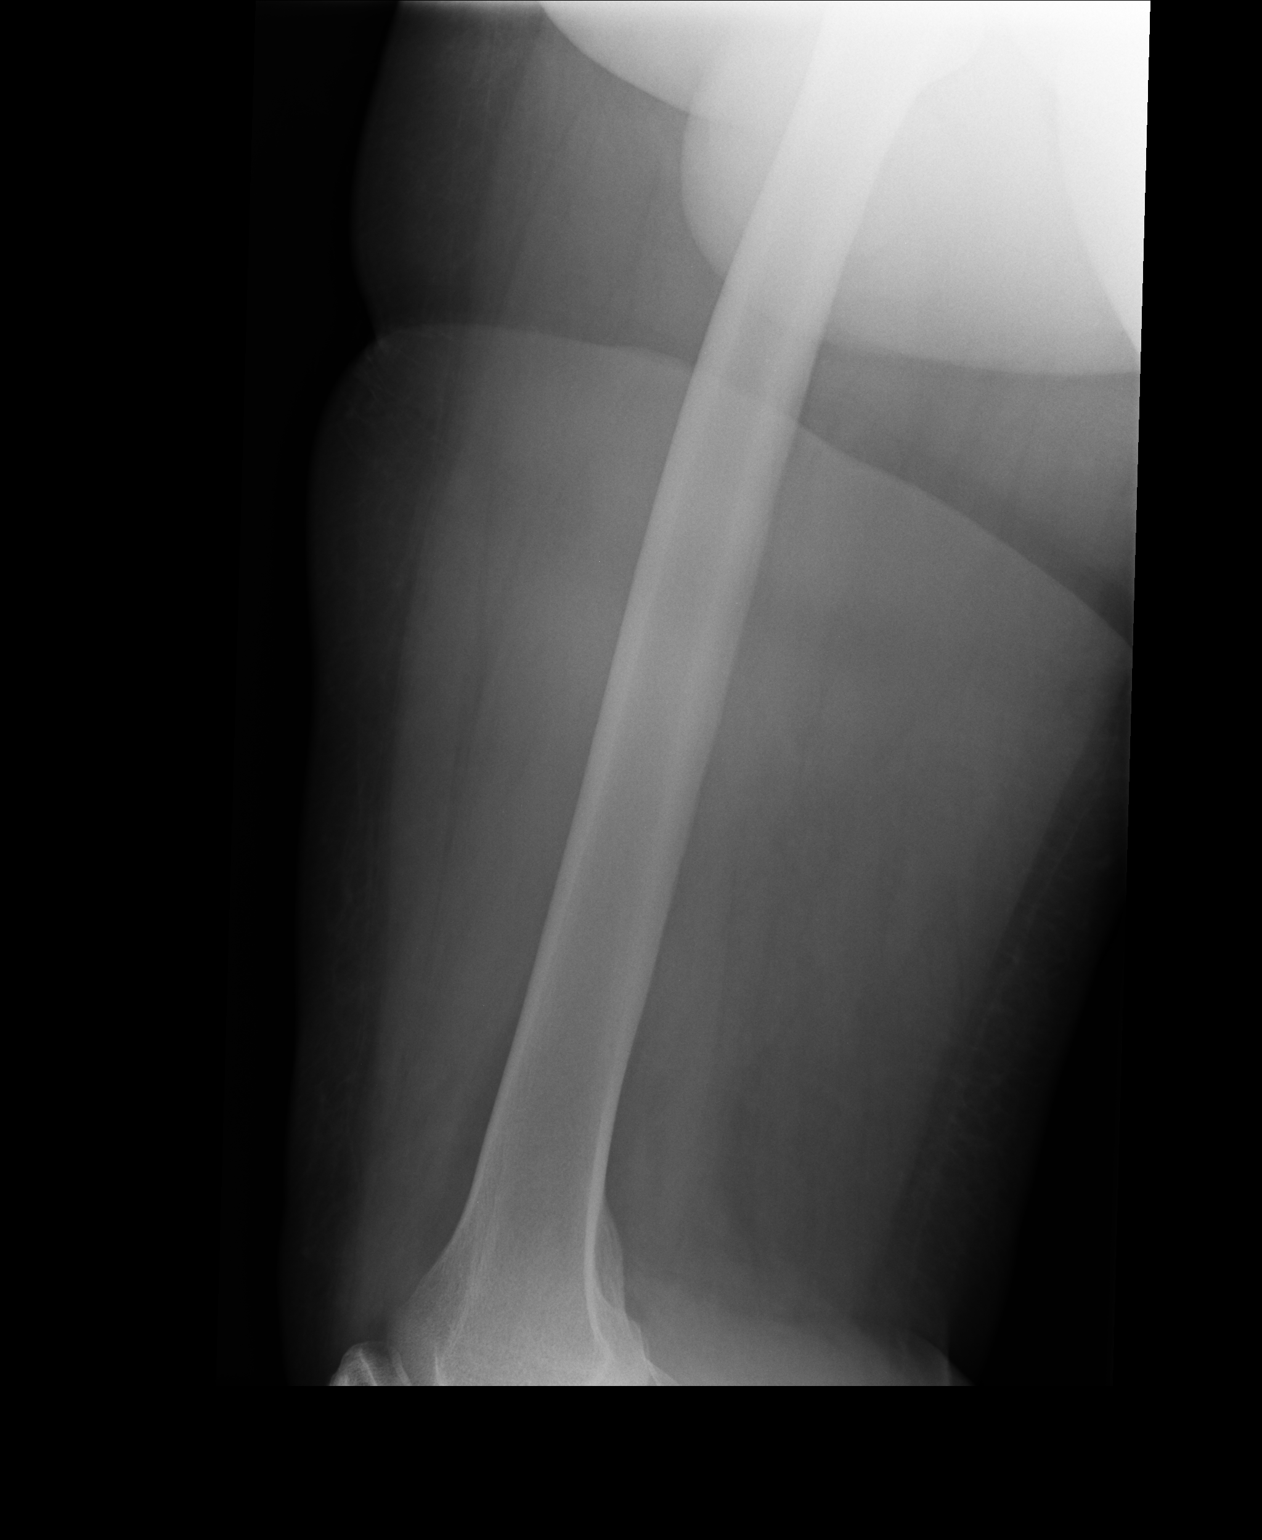

[2 of 2 positions shown; findings below may reference images not displayed]

FINDINGS: Two views of the right femur were obtained although the
proximal femur was not included on this exam.  No acute bony
abnormality is seen. Soft tissue swelling is noted.
IMPRESSION: No acute bony abnormality seen.

## 2013-03-20 IMAGING — CR DG TIBIA/FIBULA 2V*R*
2 series · 2 of 2 positions shown · non-contrast
Comparison: None.

CLINICAL DATA: Right leg pain and swelling

RIGHT TIBIA AND FIBULA - 2 VIEW

[AP]
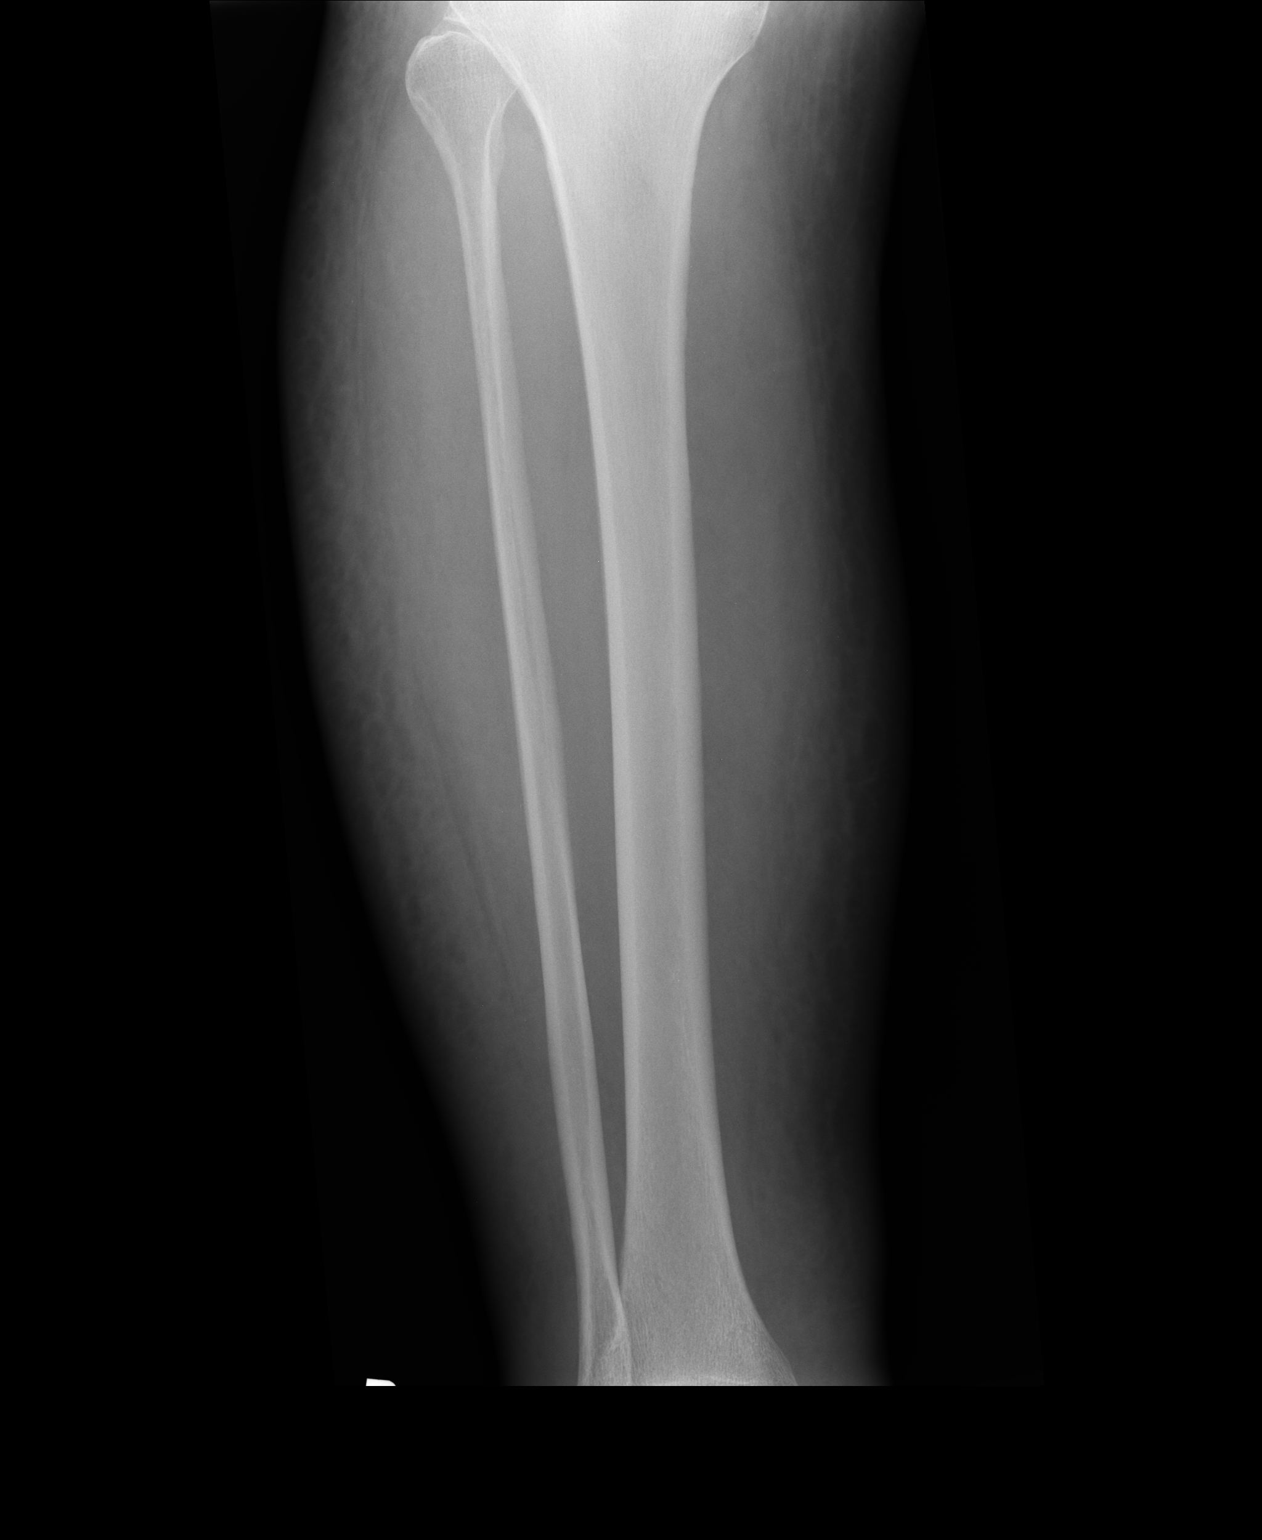

[lateral]
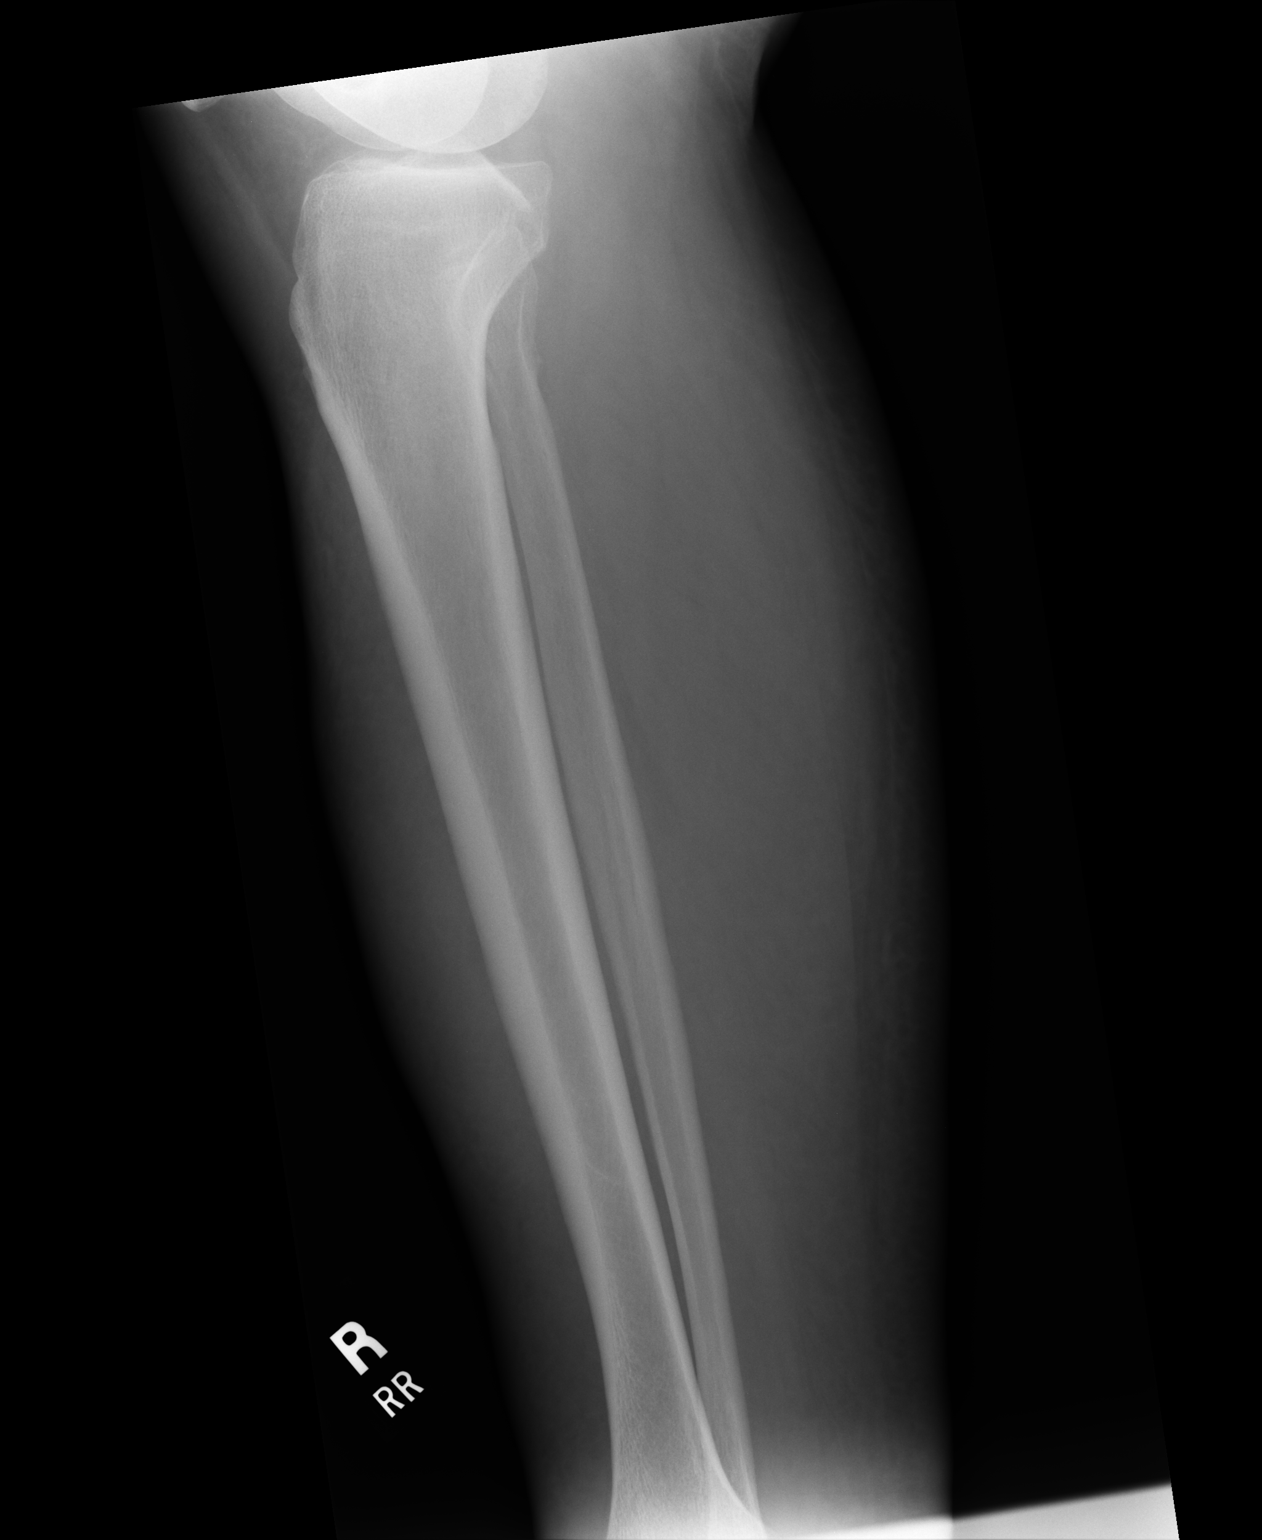

[2 of 2 positions shown; findings below may reference images not displayed]

FINDINGS: Two views of the right tibia and fibula were obtained and
reveal no evidence of acute fracture or dislocation.  Mild soft
tissue swelling is noted
IMPRESSION: Soft tissue swelling without acute bony abnormality.

## 2013-03-24 ENCOUNTER — Encounter: Payer: Self-pay | Admitting: Family Medicine

## 2013-03-25 ENCOUNTER — Encounter: Payer: Self-pay | Admitting: Family Medicine

## 2013-03-26 ENCOUNTER — Other Ambulatory Visit: Payer: Self-pay | Admitting: Family Medicine

## 2013-03-26 MED ORDER — ROSUVASTATIN CALCIUM 10 MG PO TABS: ORAL_TABLET | ORAL | Status: DC

## 2013-03-30 ENCOUNTER — Encounter: Payer: Self-pay | Admitting: Family Medicine

## 2013-04-01 ENCOUNTER — Other Ambulatory Visit: Payer: Self-pay | Admitting: Family Medicine

## 2013-04-01 ENCOUNTER — Ambulatory Visit: Payer: Self-pay | Admitting: Cardiovascular Disease

## 2013-04-01 MED ORDER — SIMVASTATIN 80 MG PO TABS: ORAL_TABLET | ORAL | Status: DC

## 2013-04-07 ENCOUNTER — Ambulatory Visit: Payer: Self-pay | Admitting: Physician Assistant

## 2013-04-09 ENCOUNTER — Other Ambulatory Visit: Payer: Self-pay

## 2013-04-09 ENCOUNTER — Ambulatory Visit: Payer: Self-pay | Admitting: Physician Assistant

## 2013-04-10 ENCOUNTER — Encounter: Payer: Self-pay | Admitting: Physician Assistant

## 2013-06-10 ENCOUNTER — Ambulatory Visit: Payer: Self-pay | Admitting: Cardiology

## 2013-06-11 ENCOUNTER — Ambulatory Visit (INDEPENDENT_AMBULATORY_CARE_PROVIDER_SITE_OTHER): Payer: Self-pay | Admitting: Cardiology

## 2013-06-11 ENCOUNTER — Encounter: Payer: Self-pay | Admitting: Cardiology

## 2013-06-11 VITALS — BP 140/90 | HR 75 | Ht 67.5 in | Wt 244.0 lb

## 2013-06-11 DIAGNOSIS — I10 Essential (primary) hypertension: Secondary | ICD-10-CM

## 2013-06-11 DIAGNOSIS — G4733 Obstructive sleep apnea (adult) (pediatric): Secondary | ICD-10-CM

## 2013-06-11 DIAGNOSIS — E785 Hyperlipidemia, unspecified: Secondary | ICD-10-CM

## 2013-06-11 MED ORDER — ROSUVASTATIN CALCIUM 10 MG PO TABS
10.0000 mg | ORAL_TABLET | Freq: Every day | ORAL | Status: DC
Start: 2013-06-11 — End: 2013-08-24

## 2013-06-11 MED ORDER — LISINOPRIL 40 MG PO TABS: 40.0000 mg | ORAL_TABLET | Freq: Every day | ORAL | Status: DC

## 2013-06-11 NOTE — Patient Instructions (Signed)
Start Crestor 10mg  daily.  Increase lisinopril to 40mg  daily.  Your physician recommends that you return for lab work in: 2 weeks--BMET.  Your physician recommends that you return for a FASTING lipid profile /liver profile in 2 months.  Your physician wants you to follow-up in: 1 year with Dr Aundra Dubin. (January 2016). You will receive a reminder letter in the mail two months in advance. If you don't receive a letter, please call our office to schedule the follow-up appointment.

## 2013-06-13 NOTE — Progress Notes (Signed)
Patient ID: Mark [name], male   DOB: [birth year], 63 y.o.   MRN: [id number] PCP: Dr. Leward Quan  63 yo with history of HTN, hyperlipidemia, and melanoma returns for cardiology evaluation.  He has had right leg lymphedema since groin lymph node dissection for melanoma treatment.  Currently, BP is running high.  He has not chest pain.  He denies exertional dyspnea.  He can walk up a flight of steps without problems. He is not taking a statin as simvastatin made him sick on his stomach.   Labs (2/13): LDL 104, HDL 24 Labs (7/13): K 3.9, creatinine 0.94 Labs (9/13): BNP 22 Labs (10/14): LDL 173, HDL 37, K 3.7, creatinine 0.95  PMH: 1. HTN 2. Melanoma s/p surgery in 8/12 with groin lymph node dissection.  This resulted in right leg lymphedema. 3. Lymphedema in right leg.  4. Stress test normal in 2006 or 2007. 5. Echo (9/13): EF 55-60%, mildly dilated LV, no significant valvular abnormalities. 6. Hyperlipidemia  7. OSA: Moderate to severe by 2013 sleep study.  SH: Lives in [place] alone.  Has daughter in [place].  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  Nonsmoker.    FH: Grandfather with MI at 32  ROS: All systems reviewed and negative except as per HPI.   Current Outpatient Prescriptions  Medication Sig Dispense Refill  . aspirin EC 81 MG tablet Take 1 tablet (81 mg total) by mouth daily.      . Cholecalciferol (VITAMIN D-3 PO) Take 1,000 Units by mouth daily.       . fish oil-omega-3 fatty acids 1000 MG capsule Take 1200 mg capsule by mouth once a day.      . hydrochlorothiazide (HYDRODIURIL) 25 MG tablet TAKE ONE-HALF TABLET BY MOUTH IN THE MORNING TO LOWER BLOOD PRESSURE  90 tablet  1  . Multiple Vitamin (MULTIVITAMIN) capsule Take 1 capsule by mouth daily.      Marland Kitchen lisinopril (PRINIVIL,ZESTRIL) 40 MG tablet Take 1 tablet (40 mg total) by mouth daily.  90 tablet  3  . rosuvastatin (CRESTOR) 10 MG tablet Take 1 tablet (10 mg total) by mouth daily.  90  tablet  3  . [DISCONTINUED] atenolol-chlorthalidone (TENORETIC) 50-25 MG per tablet Take by mouth daily. 1/2 Tablet Q AM       No current facility-administered medications for this visit.    BP 140/90  Pulse 75  Ht 5' 7.5" (1.715 m)  Wt 110.678 kg (244 lb)  BMI 37.63 kg/m2 General: NAD Neck: Thick, JVP 7 cm, no thyromegaly or thyroid nodule.  Lungs: Clear to auscultation bilaterally with normal respiratory effort. CV: Nondisplaced PMI.  Heart regular S1/S2, no S3/S4, no murmur.  Stable R>L leg swelling.  No carotid bruit.  Normal pedal pulses.  Abdomen: Soft, nontender, no hepatosplenomegaly, no distention.  Skin: Intact without lesions or rashes.  Neurologic: Alert and oriented x 3.  Psych: Normal affect.  Assessment/Plan  1. Hyperlipidemia: He is off simvastatin due to abdominal discomfort.  I will have him try Crestor 10 mg daily with lipids/LFTs in 2 months (current LDL is very high).  2. HTN: BP still running high.  Increase lisinopril to 40 mg daily with BMET in 2 wks.   3. OSA: Patient needs to be set up with CPAP.  This will likely help with BP control.  I will have him contact Dr. Gwenette Greet.   Loralie Champagne 06/13/2013

## 2013-06-16 ENCOUNTER — Encounter: Payer: Self-pay | Admitting: Cardiology

## 2013-06-16 ENCOUNTER — Telehealth: Payer: Self-pay | Admitting: Cardiology

## 2013-06-16 MED ORDER — LOSARTAN POTASSIUM 100 MG PO TABS: 100.0000 mg | ORAL_TABLET | Freq: Every day | ORAL | Status: DC

## 2013-06-16 NOTE — Telephone Encounter (Signed)
New message   C/O cough side effect from lisinopril   increase of medication from  20 mg to 40 mg .

## 2013-06-16 NOTE — Telephone Encounter (Signed)
Pt states since increasing lisinopril he has developed dry cough. He is asking if lisinopril can be changed to another medication. Will forward to Dr Aundra Dubin for review.

## 2013-06-16 NOTE — Telephone Encounter (Signed)
Replace lisinopril with losartan 100 mg daily with BMET in 2 wks.

## 2013-06-16 NOTE — Telephone Encounter (Signed)
Pt advised,verbalized understanding. 

## 2013-06-17 ENCOUNTER — Encounter: Payer: Self-pay | Admitting: Cardiology

## 2013-06-22 ENCOUNTER — Other Ambulatory Visit: Payer: Medicare HMO

## 2013-06-24 ENCOUNTER — Encounter: Payer: Self-pay | Admitting: Family Medicine

## 2013-06-30 ENCOUNTER — Other Ambulatory Visit (INDEPENDENT_AMBULATORY_CARE_PROVIDER_SITE_OTHER): Payer: Self-pay

## 2013-06-30 DIAGNOSIS — I10 Essential (primary) hypertension: Secondary | ICD-10-CM

## 2013-06-30 LAB — BASIC METABOLIC PANEL
BUN: 14 mg/dL (ref 6–23)
CO2: 31 meq/L (ref 19–32)
CREATININE: 1 mg/dL (ref 0.4–1.5)
Calcium: 9 mg/dL (ref 8.4–10.5)
Chloride: 103 mEq/L (ref 96–112)
GFR: 78.18 mL/min (ref >60.00)
GLUCOSE: 108 mg/dL — AB (ref 70–99)
Potassium: 3.7 mEq/L (ref 3.5–5.1)
Sodium: 139 mEq/L (ref 135–145)

## 2013-07-01 ENCOUNTER — Telehealth: Payer: Self-pay | Admitting: Cardiology

## 2013-07-01 NOTE — Telephone Encounter (Signed)
New problem   Pt call about labs results. Please call pt

## 2013-07-01 NOTE — Telephone Encounter (Signed)
Pt called to get labs results. Results for 1/27 BMET given.  Pt wanted to know the LDL. Pt is aware that on the last office visit on 06/11/13 MD recommended for Pt to have labs BMET on 1/27 and Lipid panel and LFT in 2 months; this appointment is  on 08/17/13 at 7:30 Am. Pt verbalized understanding.

## 2013-07-01 NOTE — Telephone Encounter (Signed)
Left pt a message to call back. 

## 2013-07-10 NOTE — Telephone Encounter (Signed)
F/u   Pt want to know is lab results. Please call pt

## 2013-07-10 NOTE — Telephone Encounter (Signed)
Spoke with patient about recent BMET results

## 2013-07-20 ENCOUNTER — Other Ambulatory Visit: Payer: Medicare HMO

## 2013-08-17 ENCOUNTER — Other Ambulatory Visit (INDEPENDENT_AMBULATORY_CARE_PROVIDER_SITE_OTHER): Payer: Medicare HMO

## 2013-08-17 DIAGNOSIS — I10 Essential (primary) hypertension: Secondary | ICD-10-CM

## 2013-08-17 LAB — HEPATIC FUNCTION PANEL
ALK PHOS: 74 U/L (ref 39–117)
ALT: 28 U/L (ref 0–53)
AST: 22 U/L (ref 0–37)
Albumin: 3.8 g/dL (ref 3.5–5.2)
BILIRUBIN DIRECT: 0 mg/dL (ref 0.0–0.3)
Total Bilirubin: 0.4 mg/dL (ref 0.3–1.2)
Total Protein: 6.6 g/dL (ref 6.0–8.3)

## 2013-08-17 LAB — LIPID PANEL
CHOL/HDL RATIO: 5
CHOLESTEROL: 171 mg/dL (ref 0–200)
HDL: 36.2 mg/dL — ABNORMAL LOW (ref >39.00)
LDL CALC: 112 mg/dL — AB (ref 0–99)
Triglycerides: 115 mg/dL (ref 0.0–149.0)
VLDL: 23 mg/dL (ref 0.0–40.0)

## 2013-08-18 ENCOUNTER — Encounter: Payer: Self-pay | Admitting: Cardiology

## 2013-08-24 ENCOUNTER — Other Ambulatory Visit: Payer: Self-pay | Admitting: *Deleted

## 2013-08-24 DIAGNOSIS — E785 Hyperlipidemia, unspecified: Secondary | ICD-10-CM

## 2013-08-24 DIAGNOSIS — I10 Essential (primary) hypertension: Secondary | ICD-10-CM

## 2013-08-24 MED ORDER — ROSUVASTATIN CALCIUM 20 MG PO TABS: 20.0000 mg | ORAL_TABLET | Freq: Every day | ORAL | Status: DC

## 2013-09-13 ENCOUNTER — Encounter: Payer: Self-pay | Admitting: Family Medicine

## 2013-09-13 ENCOUNTER — Encounter: Payer: Self-pay | Admitting: Cardiology

## 2013-09-15 ENCOUNTER — Telehealth: Payer: Self-pay | Admitting: Cardiology

## 2013-09-15 NOTE — Telephone Encounter (Signed)
New message    Returning Anne's call from yesterday.

## 2013-09-15 NOTE — Telephone Encounter (Signed)
lmtcb

## 2013-09-16 NOTE — Telephone Encounter (Signed)
Pt asking if fish oil/omega 3 and losartan were causing pain up right leg with tingling in his fingers. I reviewed with Dr Vassie Moment did not think they were related, but recommended pt stop fish oil. Pt advised, verbalized understanding.

## 2013-10-19 ENCOUNTER — Other Ambulatory Visit (INDEPENDENT_AMBULATORY_CARE_PROVIDER_SITE_OTHER): Payer: Medicare HMO

## 2013-10-19 DIAGNOSIS — I10 Essential (primary) hypertension: Secondary | ICD-10-CM

## 2013-10-19 DIAGNOSIS — E785 Hyperlipidemia, unspecified: Secondary | ICD-10-CM

## 2013-10-19 LAB — LIPID PANEL
CHOLESTEROL: 130 mg/dL (ref 0–200)
HDL: 33.9 mg/dL — ABNORMAL LOW (ref >39.00)
LDL Cholesterol: 72 mg/dL (ref 0–99)
Total CHOL/HDL Ratio: 4
Triglycerides: 121 mg/dL (ref 0.0–149.0)
VLDL: 24.2 mg/dL (ref 0.0–40.0)

## 2013-10-19 LAB — HEPATIC FUNCTION PANEL
ALK PHOS: 68 U/L (ref 39–117)
ALT: 17 U/L (ref 0–53)
AST: 17 U/L (ref 0–37)
Albumin: 3.7 g/dL (ref 3.5–5.2)
BILIRUBIN DIRECT: 0 mg/dL (ref 0.0–0.3)
BILIRUBIN TOTAL: 0.8 mg/dL (ref 0.2–1.2)
TOTAL PROTEIN: 6.6 g/dL (ref 6.0–8.3)

## 2013-12-02 ENCOUNTER — Encounter: Payer: Self-pay | Admitting: Cardiology

## 2013-12-03 MED ORDER — LOSARTAN POTASSIUM 100 MG PO TABS: 100.0000 mg | ORAL_TABLET | Freq: Every day | ORAL | Status: DC

## 2014-03-14 ENCOUNTER — Other Ambulatory Visit: Payer: Self-pay | Admitting: Cardiology

## 2014-03-25 ENCOUNTER — Other Ambulatory Visit: Payer: Self-pay | Admitting: Family Medicine

## 2014-03-26 ENCOUNTER — Encounter: Payer: Self-pay | Admitting: Family Medicine

## 2014-03-26 ENCOUNTER — Other Ambulatory Visit: Payer: Self-pay | Admitting: Physician Assistant

## 2014-03-29 ENCOUNTER — Other Ambulatory Visit: Payer: Self-pay

## 2014-03-29 MED ORDER — HYDROCHLOROTHIAZIDE 25 MG PO TABS: ORAL_TABLET | ORAL | Status: DC

## 2014-03-30 ENCOUNTER — Other Ambulatory Visit: Payer: Self-pay | Admitting: Cardiology

## 2014-03-30 ENCOUNTER — Encounter: Payer: Self-pay | Admitting: Cardiology

## 2014-03-30 MED ORDER — HYDROCHLOROTHIAZIDE 25 MG PO TABS: ORAL_TABLET | ORAL | Status: DC

## 2014-04-04 ENCOUNTER — Encounter: Payer: Self-pay | Admitting: Cardiology

## 2014-04-22 ENCOUNTER — Ambulatory Visit (INDEPENDENT_AMBULATORY_CARE_PROVIDER_SITE_OTHER): Payer: Medicare HMO | Admitting: Family Medicine

## 2014-04-22 ENCOUNTER — Encounter: Payer: Self-pay | Admitting: Family Medicine

## 2014-04-22 VITALS — BP 150/88 | HR 67 | Temp 98.2°F | Resp 16 | Ht 68.0 in | Wt 247.0 lb

## 2014-04-22 DIAGNOSIS — R519 Headache, unspecified: Secondary | ICD-10-CM

## 2014-04-22 DIAGNOSIS — I10 Essential (primary) hypertension: Secondary | ICD-10-CM

## 2014-04-22 DIAGNOSIS — Z1212 Encounter for screening for malignant neoplasm of rectum: Secondary | ICD-10-CM

## 2014-04-22 DIAGNOSIS — R51 Headache: Secondary | ICD-10-CM

## 2014-04-22 DIAGNOSIS — Z0000 Encounter for general adult medical examination without abnormal findings: Secondary | ICD-10-CM

## 2014-04-22 DIAGNOSIS — Z125 Encounter for screening for malignant neoplasm of prostate: Secondary | ICD-10-CM

## 2014-04-22 DIAGNOSIS — Z1211 Encounter for screening for malignant neoplasm of colon: Secondary | ICD-10-CM

## 2014-04-22 LAB — THYROID PANEL WITH TSH
Free Thyroxine Index: 2.3 (ref 1.4–3.8)
T3 Uptake: 31 % (ref 22–35)
T4, Total: 7.3 ug/dL (ref 4.5–12.0)
TSH: 1.382 u[IU]/mL (ref 0.350–4.500)

## 2014-04-22 LAB — COMPLETE METABOLIC PANEL WITHOUT GFR
ALT: 18 U/L (ref 0–53)
AST: 18 U/L (ref 0–37)
Albumin: 4 g/dL (ref 3.5–5.2)
Alkaline Phosphatase: 72 U/L (ref 39–117)
BUN: 14 mg/dL (ref 6–23)
CO2: 27 meq/L (ref 19–32)
Calcium: 9.1 mg/dL (ref 8.4–10.5)
Chloride: 106 meq/L (ref 96–112)
Creat: 0.91 mg/dL (ref 0.50–1.35)
GFR, Est African American: >89 mL/min
GFR, Est Non African American: >89 mL/min
Glucose, Bld: 109 mg/dL — ABNORMAL HIGH (ref 70–99)
Potassium: 3.8 meq/L (ref 3.5–5.3)
Sodium: 141 meq/L (ref 135–145)
Total Bilirubin: 0.6 mg/dL (ref 0.2–1.2)
Total Protein: 6.4 g/dL (ref 6.0–8.3)

## 2014-04-22 LAB — CBC WITH DIFFERENTIAL/PLATELET
Basophils Absolute: 0.1 10*3/uL (ref 0.0–0.1)
Basophils Relative: 1 % (ref 0–1)
EOS ABS: 0.2 10*3/uL (ref 0.0–0.7)
Eosinophils Relative: 3 % (ref 0–5)
HEMATOCRIT: 39.5 % (ref 39.0–52.0)
Hemoglobin: 13.2 g/dL (ref 13.0–17.0)
Lymphocytes Relative: 28 % (ref 12–46)
Lymphs Abs: 1.8 10*3/uL (ref 0.7–4.0)
MCH: 28.8 pg (ref 26.0–34.0)
MCHC: 33.4 g/dL (ref 30.0–36.0)
MCV: 86.1 fL (ref 78.0–100.0)
MONOS PCT: 8 % (ref 3–12)
MPV: 9.2 fL — ABNORMAL LOW (ref 9.4–12.4)
Monocytes Absolute: 0.5 10*3/uL (ref 0.1–1.0)
NEUTROS PCT: 60 % (ref 43–77)
Neutro Abs: 3.8 10*3/uL (ref 1.7–7.7)
PLATELETS: 283 10*3/uL (ref 150–400)
RBC: 4.59 MIL/uL (ref 4.22–5.81)
RDW: 14 % (ref 11.5–15.5)
WBC: 6.4 10*3/uL (ref 4.0–10.5)

## 2014-04-22 NOTE — Progress Notes (Signed)
Subjective:    Patient ID: Mark [name], male    DOB: 02-Mar-1951, 63 y.o.   MRN: [id number]  HPI  This 63 y.o. Cauc male is here for Annual Subsequent [facility] exam. He has been compliant w/ all medications and has f/u with Dr. Aundra Dubin in Jan 2016.   HCM: CRS- ~ 10 years ago in [place], [place] (normal per pt report).           IMM- Declines vaccines.           Vision- Annually; needs corrective lenses.              Patient Active Problem List   Diagnosis Date Noted  . Lymphedema of leg 04/16/2012  . OSA (obstructive sleep apnea) 04/10/2012  . Hypertension 02/12/2012  . Melanoma 07/04/2011  . HYPERLIPIDEMIA 02/16/2009  . OBESITY, UNSPECIFIED 02/16/2009  . PERICARDIAL EFFUSION 02/16/2009  . ALLERGIC RHINITIS CAUSE UNSPECIFIED 02/16/2009  . CHEST PAIN 02/16/2009  . OPEN UNSPECIFIED DISLOCATION OF ELBOW 02/16/2009  . ARTHROSCOPY, RIGHT KNEE, HX OF 02/16/2009    Prior to Admission medications   Medication Sig Start Date End Date Taking? Authorizing Provider  aspirin EC 81 MG tablet Take 1 tablet (81 mg total) by mouth daily. 02/12/12  Yes Larey Dresser, MD  Cholecalciferol (VITAMIN D-3 PO) Take 1,000 Units by mouth daily.    Yes Historical Provider, MD  CRESTOR 20 MG tablet TAKE ONE TABLET BY MOUTH ONCE DAILY 03/16/14  Yes Larey Dresser, MD  hydrochlorothiazide (HYDRODIURIL) 25 MG tablet TAKE ON-HALF BY MOUTH ONCE DAILY IN THE MORNING FOR BLOOD PRESSURE 03/30/14  Yes Larey Dresser, MD  losartan (COZAAR) 100 MG tablet Take 1 tablet (100 mg total) by mouth daily. 12/03/13  Yes Larey Dresser, MD  Multiple Vitamin (MULTIVITAMIN) capsule Take 1 capsule by mouth daily.   Yes Historical Provider, MD    History   Social History  . Marital Status: Divorced    Spouse Name: N/A    Number of Children: 1  . Years of Education: N/A   Occupational History  . Paediatric nurse    Social History Main Topics  . Smoking status: Never Smoker   . Smokeless tobacco: Not on file    . Alcohol Use: Yes     Comment: occ beer/wine  . Drug Use: No  . Sexual Activity: Not on file   Other Topics Concern  . Not on file   Social History Narrative   Exercise walking daily   Family History  Problem Relation Age of Onset  . Lung cancer Mother 40  . Cancer Mother   . Lung cancer Father 54  . Cancer Father   . Lung cancer Maternal Grandmother 77    Lung cancer  . Heart disease Maternal Grandfather   . Heart disease Paternal Grandfather     Review of Systems  Constitutional: Positive for diaphoresis.       Sweating only occurs on back after activity.  Eyes: Positive for redness. Negative for visual disturbance.       Tearing.  Respiratory: Negative.   Cardiovascular: Positive for leg swelling.       R lower ext more edematous than L (s/p surgery with lymph node resection / melanoma)  Gastrointestinal: Negative.   Endocrine: Negative.   Genitourinary:       Occasional nocturia but no hesitancy or decreased stream; intermittent dribbling.  Musculoskeletal: Positive for neck stiffness. Negative for myalgias, back pain, joint swelling, gait  problem and neck pain.  Skin:       Melanoma surveillance w/ DERM at [facility] every 6 months.  Allergic/Immunologic: Positive for environmental allergies.  Neurological: Positive for dizziness and headaches. Negative for tremors, syncope, speech difficulty, weakness and numbness.       Mild HAs localized at crown and in frontal area; associated w/ infrequent nausea and dizziness. Onset while sitting/ inactivity.  Hematological: Negative.   Psychiatric/Behavioral: Negative.        Objective:   Physical Exam  Constitutional: He is oriented to person, place, and time. Vital signs are normal. He appears well-developed and well-nourished. No distress.  HENT:  Head: Normocephalic and atraumatic.  Right Ear: Tympanic membrane, external ear and ear canal normal. Decreased hearing is noted.  Left Ear: Tympanic  membrane, external ear and ear canal normal. Decreased hearing is noted.  Nose: Nose normal. No mucosal edema, nasal deformity or septal deviation.  Mouth/Throat: Uvula is midline, oropharynx is clear and moist and mucous membranes are normal. No oral lesions. Normal dentition. No dental caries.  Wears bilateral hearing aids.  Eyes: Conjunctivae, EOM and lids are normal. Pupils are equal, round, and reactive to light. No scleral icterus.  Neck: Trachea normal, normal range of motion, full passive range of motion without pain and phonation normal. Neck supple. No spinous process tenderness and no muscular tenderness present. Carotid bruit is not present. Normal range of motion present. No thyroid mass and no thyromegaly present.  Cardiovascular: Normal rate, regular rhythm, S1 normal, S2 normal and intact distal pulses.   No extrasystoles are present. Exam reveals distant heart sounds. Exam reveals no gallop and no friction rub.   No murmur heard. Pulmonary/Chest: Effort normal and breath sounds normal. No respiratory distress. He has no decreased breath sounds. He has no wheezes. He has no rales.  Abdominal: Soft. Normal appearance and bowel sounds are normal. He exhibits no distension and no mass. There is no hepatosplenomegaly. There is no tenderness. There is no guarding and no CVA tenderness.  Intra- abdominal bruits, masses or HSM difficult to assess due to adipose tissue.  Genitourinary:  Deferred.  Musculoskeletal:       Right shoulder: Normal.       Left shoulder: Normal.       Right elbow: He exhibits decreased range of motion and deformity. No tenderness found.       Left elbow: Normal.       Cervical back: Normal.       Thoracic back: Normal.       Lumbar back: Normal.       Right lower leg: He exhibits edema. He exhibits no tenderness, no bony tenderness and no deformity.       Left lower leg: Normal.  Remainder of exam remarkable for degenerative changes in major joints.    Lymphadenopathy:       Head (right side): No submental, no submandibular, no tonsillar, no preauricular, no posterior auricular and no occipital adenopathy present.       Head (left side): No submental, no submandibular, no tonsillar, no preauricular, no posterior auricular and no occipital adenopathy present.    He has no cervical adenopathy.    He has no axillary adenopathy.       Right: No supraclavicular adenopathy present.       Left: No supraclavicular adenopathy present.  Neurological: He is alert and oriented to person, place, and time. He has normal strength. He displays no atrophy and no  tremor. No cranial nerve deficit or sensory deficit. He exhibits normal muscle tone. He displays a negative Romberg sign. Coordination and gait normal.  Reflex Scores:      Tricep reflexes are 1+ on the right side and 1+ on the left side.      Bicep reflexes are 2+ on the right side and 2+ on the left side.      Brachioradialis reflexes are 1+ on the right side and 1+ on the left side.      Patellar reflexes are 2+ on the right side and 2+ on the left side. Skin: Skin is warm, dry and intact. Lesion noted. No ecchymosis and no rash noted. He is not diaphoretic. No erythema. No pallor.  Multiple nevi; well healed R flank scar.  Psychiatric: He has a normal mood and affect. His speech is normal and behavior is normal. Judgment and thought content normal. Cognition and memory are normal.  Mild speech impediment.  Nursing note and vitals reviewed.      Assessment & Plan:  Routine general medical examination at a health care facility - Plan: Thyroid Panel With TSH, IFOBT POC (occult bld, rslt in office)  Essential hypertension - Stable on current medications. Follow-up w/ Dr. Aundra Dubin as scheduled. Plan: COMPLETE METABOLIC PANEL WITH GFR, CBC with Differential  Headache Disorder- Reviewed CT Cervical Spine (02/24/2005) >> Degenerative changes C6-C7 and C7-T1 w/ associated disc bulge and small anterior  end plate spurring. Advised pt that this condition may have progressed and will need re-evaluation at next visit. Needs good supportive pillow for restful sleep. If HA worsens, he should RTC sooner than 3 months.  Screening for prostate cancer - No prostate exam today; 2014 PSA= normal and pt minimally symptomatic. If PSA increased significantly, will recall pt for DRE.  Plan: PSA, Medicare  Screening for colorectal cancer - Plan: Ambulatory referral to Gastroenterology

## 2014-04-22 NOTE — Patient Instructions (Signed)
Keeping you healthy  Get these tests  Blood pressure- Have your blood pressure checked once a year by your healthcare provider.  Normal blood pressure is 120/80  Weight- Have your body mass index (BMI) calculated to screen for obesity.  BMI is a measure of body fat based on height and weight. You can also calculate your own BMI at ViewBanking.si.  Cholesterol- Have your cholesterol checked every year.  Diabetes- Have your blood sugar checked regularly if you have high blood pressure, high cholesterol, have a family history of diabetes or if you are overweight.  Screening for Colon Cancer- Colonoscopy starting at age 45.  Screening may begin sooner depending on your family history and other health conditions. Follow up colonoscopy as directed by your Gastroenterologist.    I am ordering a GI referral; the first visit is for discussion, history, brief exam, etc. You and the GI physician will discuss when to schedule the actual colonoscopy. It will probably be early in 2016.  Screening for Prostate Cancer- Both blood work (PSA) and a rectal exam help screen for Prostate Cancer.  Screening begins at age 56 with African-American men and at age 39 with Caucasian men.  Screening may begin sooner depending on your family history.  Take these medicines  Aspirin- One aspirin daily can help prevent Heart disease and Stroke.  Flu shot- Every fall. You have declined this vaccine.  Tetanus- Every 10 years.  Zostavax- Once after the age of 33 to prevent Shingles.  Pneumonia shot- Once after the age of 69; if you are younger than 59, ask your healthcare provider if you need a Pneumonia shot. You have declined this vaccine.  Take these steps  Don't smoke- If you do smoke, talk to your doctor about quitting.  For tips on how to quit, go to www.smokefree.gov or call 1-800-QUIT-NOW.  Be physically active- Exercise 5 days a week for at least 30 minutes.  If you are not already physically  active start slow and gradually work up to 30 minutes of moderate physical activity.  Examples of moderate activity include walking briskly, mowing the yard, dancing, swimming, bicycling, etc.  Eat a healthy diet- Eat a variety of healthy food such as fruits, vegetables, low fat milk, low fat cheese, yogurt, lean meant, poultry, fish, beans, tofu, etc. For more information go to www.thenutritionsource.org  Drink alcohol in moderation- Limit alcohol intake to less than two drinks a day. Never drink and drive.  Dentist- Brush and floss twice daily; visit your dentist twice a year.  Depression- Your emotional health is as important as your physical health. If you're feeling down, or losing interest in things you would normally enjoy please talk to your healthcare provider.  Eye exam- Visit your eye doctor every year.  Safe sex- If you may be exposed to a sexually transmitted infection, use a condom.  Seat belts- Seat belts can save your life; always wear one.  Smoke/Carbon Monoxide detectors- These detectors need to be installed on the appropriate level of your home.  Replace batteries at least once a year.  Skin cancer- When out in the sun, cover up and use sunscreen 15 SPF or higher.  Violence- If anyone is threatening you, please tell your healthcare provider.  Living Will/ Health care power of attorney- Speak with your healthcare provider and family.    Headaches, Frequently Asked Questions MIGRAINE HEADACHES Q: What is migraine? What causes it? How can I treat it? A: Generally, migraine headaches begin as a dull ache.  Then they develop into a constant, throbbing, and pulsating pain. You may experience pain at the temples. You may experience pain at the front or back of one or both sides of the head. The pain is usually accompanied by a combination of:  Nausea.  Vomiting.  Sensitivity to light and noise. Some people (about 15%) experience an aura (see below) before an attack. The  cause of migraine is believed to be chemical reactions in the brain. Treatment for migraine may include over-the-counter or prescription medications. It may also include self-help techniques. These include relaxation training and biofeedback.  Q: What is an aura? A: About 15% of people with migraine get an "aura". This is a sign of neurological symptoms that occur before a migraine headache. You may see wavy or jagged lines, dots, or flashing lights. You might experience tunnel vision or blind spots in one or both eyes. The aura can include visual or auditory hallucinations (something imagined). It may include disruptions in smell (such as strange odors), taste or touch. Other symptoms include:  Numbness.  A "pins and needles" sensation.  Difficulty in recalling or speaking the correct word. These neurological events may last as long as 60 minutes. These symptoms will fade as the headache begins. Q: What is a trigger? A: Certain physical or environmental factors can lead to or "trigger" a migraine. These include:  Foods.  Hormonal changes.  Weather.  Stress. It is important to remember that triggers are different for everyone. To help prevent migraine attacks, you need to figure out which triggers affect you. Keep a headache diary. This is a good way to track triggers. The diary will help you talk to your healthcare professional about your condition. Q: Does weather affect migraines? A: Bright sunshine, hot, humid conditions, and drastic changes in barometric pressure may lead to, or "trigger," a migraine attack in some people. But studies have shown that weather does not act as a trigger for everyone with migraines. Q: What is the link between migraine and hormones? A: Hormones start and regulate many of your body's functions. Hormones keep your body in balance within a constantly changing environment. The levels of hormones in your body are unbalanced at times. Examples are during  menstruation, pregnancy, or menopause. That can lead to a migraine attack. In fact, about three quarters of all women with migraine report that their attacks are related to the menstrual cycle.  Q: Is there an increased risk of stroke for migraine sufferers? A: The likelihood of a migraine attack causing a stroke is very remote. That is not to say that migraine sufferers cannot have a stroke associated with their migraines. In persons under age 59, the most common associated factor for stroke is migraine headache. But over the course of a person's normal life span, the occurrence of migraine headache may actually be associated with a reduced risk of dying from cerebrovascular disease due to stroke.  Q: What are acute medications for migraine? A: Acute medications are used to treat the pain of the headache after it has started. Examples over-the-counter medications, NSAIDs, ergots, and triptans.  Q: What are the triptans? A: Triptans are the newest class of abortive medications. They are specifically targeted to treat migraine. Triptans are vasoconstrictors. They moderate some chemical reactions in the brain. The triptans work on receptors in your brain. Triptans help to restore the balance of a neurotransmitter called serotonin. Fluctuations in levels of serotonin are thought to be a main cause of migraine.  Q: Are  over-the-counter medications for migraine effective? A: Over-the-counter, or "OTC," medications may be effective in relieving mild to moderate pain and associated symptoms of migraine. But you should see your caregiver before beginning any treatment regimen for migraine.  Q: What are preventive medications for migraine? A: Preventive medications for migraine are sometimes referred to as "prophylactic" treatments. They are used to reduce the frequency, severity, and length of migraine attacks. Examples of preventive medications include antiepileptic medications, antidepressants, beta-blockers,  calcium channel blockers, and NSAIDs (nonsteroidal anti-inflammatory drugs). Q: Why are anticonvulsants used to treat migraine? A: During the past few years, there has been an increased interest in antiepileptic drugs for the prevention of migraine. They are sometimes referred to as "anticonvulsants". Both epilepsy and migraine may be caused by similar reactions in the brain.  Q: Why are antidepressants used to treat migraine? A: Antidepressants are typically used to treat people with depression. They may reduce migraine frequency by regulating chemical levels, such as serotonin, in the brain.  Q: What alternative therapies are used to treat migraine? A: The term "alternative therapies" is often used to describe treatments considered outside the scope of conventional Western medicine. Examples of alternative therapy include acupuncture, acupressure, and yoga. Another common alternative treatment is herbal therapy. Some herbs are believed to relieve headache pain. Always discuss alternative therapies with your caregiver before proceeding. Some herbal products contain arsenic and other toxins. TENSION HEADACHES Q: What is a tension-type headache? What causes it? How can I treat it? A: Tension-type headaches occur randomly. They are often the result of temporary stress, anxiety, fatigue, or anger. Symptoms include soreness in your temples, a tightening band-like sensation around your head (a "vice-like" ache). Symptoms can also include a pulling feeling, pressure sensations, and contracting head and neck muscles. The headache begins in your forehead, temples, or the back of your head and neck. Treatment for tension-type headache may include over-the-counter or prescription medications. Treatment may also include self-help techniques such as relaxation training and biofeedback. CLUSTER HEADACHES Q: What is a cluster headache? What causes it? How can I treat it? A: Cluster headache gets its name because the  attacks come in groups. The pain arrives with little, if any, warning. It is usually on one side of the head. A tearing or bloodshot eye and a runny nose on the same side of the headache may also accompany the pain. Cluster headaches are believed to be caused by chemical reactions in the brain. They have been described as the most severe and intense of any headache type. Treatment for cluster headache includes prescription medication and oxygen. SINUS HEADACHES Q: What is a sinus headache? What causes it? How can I treat it? A: When a cavity in the bones of the face and skull (a sinus) becomes inflamed, the inflammation will cause localized pain. This condition is usually the result of an allergic reaction, a tumor, or an infection. If your headache is caused by a sinus blockage, such as an infection, you will probably have a fever. An x-ray will confirm a sinus blockage. Your caregiver's treatment might include antibiotics for the infection, as well as antihistamines or decongestants.  REBOUND HEADACHES Q: What is a rebound headache? What causes it? How can I treat it? A: A pattern of taking acute headache medications too often can lead to a condition known as "rebound headache." A pattern of taking too much headache medication includes taking it more than 2 days per week or in excessive amounts. That means more than the  label or a caregiver advises. With rebound headaches, your medications not only stop relieving pain, they actually begin to cause headaches. Doctors treat rebound headache by tapering the medication that is being overused. Sometimes your caregiver will gradually substitute a different type of treatment or medication. Stopping may be a challenge. Regularly overusing a medication increases the potential for serious side effects. Consult a caregiver if you regularly use headache medications more than 2 days per week or more than the label advises. ADDITIONAL QUESTIONS AND ANSWERS Q: What is  biofeedback? A: Biofeedback is a self-help treatment. Biofeedback uses special equipment to monitor your body's involuntary physical responses. Biofeedback monitors:  Breathing.  Pulse.  Heart rate.  Temperature.  Muscle tension.  Brain activity. Biofeedback helps you refine and perfect your relaxation exercises. You learn to control the physical responses that are related to stress. Once the technique has been mastered, you do not need the equipment any more. Q: Are headaches hereditary? A: Four out of five (80%) of people that suffer report a family history of migraine. Scientists are not sure if this is genetic or a family predisposition. Despite the uncertainty, a child has a 50% chance of having migraine if one parent suffers. The child has a 75% chance if both parents suffer.  Q: Can children get headaches? A: By the time they reach high school, most young people have experienced some type of headache. Many safe and effective approaches or medications can prevent a headache from occurring or stop it after it has begun.  Q: What type of doctor should I see to diagnose and treat my headache? A: Start with your primary caregiver. Discuss his or her experience and approach to headaches. Discuss methods of classification, diagnosis, and treatment. Your caregiver may decide to recommend you to a headache specialist, depending upon your symptoms or other physical conditions. Having diabetes, allergies, etc., may require a more comprehensive and inclusive approach to your headache. The National Headache Foundation will provide, upon request, a list of [facility] physician members in your state. Document Released: 08/11/2003 Document Revised: 08/13/2011 Document Reviewed: 01/19/2008 [facility] Patient Information 2015 [place], [place]. This information is not intended to replace advice given to you by your health care provider. Make sure you discuss any questions you have with your health care  provider.   I will see you again in 3 months to re- evaluate your headaches. I think we should consider an xray of your cervical spine. If the headaches worsen before them, schedule an appointment with me.  GEt a good supportiv epilow that gives your head and neck good alignment while you sleep. This can help reduce or alleviate the headaches.

## 2014-04-23 LAB — PSA, MEDICARE: PSA: 1.43 ng/mL (ref <=4.00)

## 2014-05-19 LAB — IFOBT (OCCULT BLOOD): IFOBT: NEGATIVE

## 2014-05-19 NOTE — Addendum Note (Signed)
Addended byWalden Field A on: 05/19/2014 02:41 PM   Modules accepted: Orders

## 2014-06-18 ENCOUNTER — Encounter: Payer: Self-pay | Admitting: Cardiology

## 2014-06-18 ENCOUNTER — Ambulatory Visit (INDEPENDENT_AMBULATORY_CARE_PROVIDER_SITE_OTHER): Payer: Medicare HMO | Admitting: Cardiology

## 2014-06-18 VITALS — BP 142/86 | HR 80 | Ht 68.0 in | Wt 248.0 lb

## 2014-06-18 DIAGNOSIS — I10 Essential (primary) hypertension: Secondary | ICD-10-CM

## 2014-06-18 DIAGNOSIS — G4733 Obstructive sleep apnea (adult) (pediatric): Secondary | ICD-10-CM

## 2014-06-18 DIAGNOSIS — E785 Hyperlipidemia, unspecified: Secondary | ICD-10-CM

## 2014-06-18 DIAGNOSIS — E669 Obesity, unspecified: Secondary | ICD-10-CM

## 2014-06-18 MED ORDER — HYDROCHLOROTHIAZIDE 25 MG PO TABS: 25.0000 mg | ORAL_TABLET | Freq: Every day | ORAL | Status: DC

## 2014-06-18 NOTE — Patient Instructions (Signed)
Increase HCTZ (hydrochlorothiazide) to 25mg  daily.  Your physician recommends that you return for a FASTING lipid profile /BMET in about 2 weeks.  You have been referred to Dr Gwenette Greet in pulmonary for evaluation and management of sleep apnea/CPAP  Your physician wants you to follow-up in: 1 year with Dr Aundra Dubin. (January 2017).  You will receive a reminder letter in the mail two months in advance. If you don't receive a letter, please call our office to schedule the follow-up appointment.

## 2014-06-19 NOTE — Progress Notes (Signed)
Patient ID: Mark [name], male   DOB: June 07, 1950, 64 y.o.   MRN: [id number] PCP: Dr. Leward Quan  64 yo with history of HTN, hyperlipidemia, and melanoma returns for cardiology evaluation.  He has had right leg lymphedema since groin lymph node dissection for melanoma treatment.  BP still runs high.   He has not chest pain.  No dyspnea walking on flat ground, mild dyspnea on stairs.  His weight is up 4 lbs since last appointment. No lightheadedness or syncope.  No palpitations.  Does not get much exercise.   ECG: NSR, nonspecific ST-T abnormalities  Labs (2/13): LDL 104, HDL 24 Labs (7/13): K 3.9, creatinine 0.94 Labs (9/13): BNP 22 Labs (10/14): LDL 173, HDL 37, K 3.7, creatinine 0.95 Labs (5/15): LDL 72, HDL 34 Labs (11/15): K 3.8, creatinine 0.91, TSH normal  PMH: 1. HTN: ACEI cough 2. Melanoma s/p surgery in 8/12 with groin lymph node dissection.  This resulted in right leg lymphedema. 3. Lymphedema in right leg.  4. Stress test normal in 2006 or 2007. 5. Echo (9/13): EF 55-60%, mildly dilated LV, no significant valvular abnormalities. 6. Hyperlipidemia  7. OSA: Moderate to severe by 2013 sleep study.  SH: Lives in [place] alone.  Has daughter in [place].  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  Nonsmoker.    FH: Grandfather with MI at 22  ROS: All systems reviewed and negative except as per HPI.   Current Outpatient Prescriptions  Medication Sig Dispense Refill  . aspirin EC 81 MG tablet Take 1 tablet (81 mg total) by mouth daily.    . Cholecalciferol (VITAMIN D-3 PO) Take 1,000 Units by mouth daily.     . CRESTOR 20 MG tablet TAKE ONE TABLET BY MOUTH ONCE DAILY 90 tablet 0  . losartan (COZAAR) 100 MG tablet Take 1 tablet (100 mg total) by mouth daily. 30 tablet 12  . Multiple Vitamin (MULTIVITAMIN) capsule Take 1 capsule by mouth daily.    . hydrochlorothiazide (HYDRODIURIL) 25 MG tablet Take 1 tablet (25 mg total) by mouth daily. 90 tablet 3   . [DISCONTINUED] atenolol-chlorthalidone (TENORETIC) 50-25 MG per tablet Take by mouth daily. 1/2 Tablet Q AM     No current facility-administered medications for this visit.    BP 142/86 mmHg  Pulse 80  Ht 5\' 8"  (1.727 m)  Wt 248 lb (112.492 kg)  BMI 37.72 kg/m2 General: NAD Neck: Thick, JVP 7 cm, no thyromegaly or thyroid nodule.  Lungs: Clear to auscultation bilaterally with normal respiratory effort. CV: Nondisplaced PMI.  Heart regular S1/S2, no S3/S4, no murmur.  Stable R>L leg swelling.  No carotid bruit.  Normal pedal pulses.  Abdomen: Soft, nontender, no hepatosplenomegaly, no distention.  Skin: Intact without lesions or rashes.  Neurologic: Alert and oriented x 3.  Psych: Normal affect.  Assessment/Plan  1. Hyperlipidemia: Lipids good on Crestor in 5/15.  Will repeat today.  2. HTN: BP still running high.  Increase HCTZ to 25 mg daily with BMET in 2 wks.    3. OSA: Still not set up with CPAP.  Will arrange followup with sleep medicine to start.   4. Obesity: Not exercising.  I recommended that he walk for 15-20 minutes daily.    Loralie Champagne 06/19/2014

## 2014-07-07 ENCOUNTER — Other Ambulatory Visit (INDEPENDENT_AMBULATORY_CARE_PROVIDER_SITE_OTHER): Payer: Medicare HMO

## 2014-07-07 ENCOUNTER — Other Ambulatory Visit: Payer: Medicare HMO

## 2014-07-07 ENCOUNTER — Ambulatory Visit: Payer: Medicare HMO | Admitting: Pulmonary Disease

## 2014-07-07 DIAGNOSIS — I10 Essential (primary) hypertension: Secondary | ICD-10-CM

## 2014-07-07 LAB — BASIC METABOLIC PANEL
BUN: 15 mg/dL (ref 6–23)
CHLORIDE: 107 meq/L (ref 96–112)
CO2: 31 meq/L (ref 19–32)
Calcium: 8.9 mg/dL (ref 8.4–10.5)
Creatinine, Ser: 0.9 mg/dL (ref 0.40–1.50)
GFR: 91.04 mL/min (ref >60.00)
Glucose, Bld: 105 mg/dL — ABNORMAL HIGH (ref 70–99)
Potassium: 3.9 mEq/L (ref 3.5–5.1)
SODIUM: 140 meq/L (ref 135–145)

## 2014-07-07 LAB — LIPID PANEL
Cholesterol: 125 mg/dL (ref 0–200)
HDL: 35 mg/dL — ABNORMAL LOW (ref >39.00)
LDL CALC: 69 mg/dL (ref 0–99)
NONHDL: 90
Total CHOL/HDL Ratio: 4
Triglycerides: 106 mg/dL (ref 0.0–149.0)
VLDL: 21.2 mg/dL (ref 0.0–40.0)

## 2014-07-09 ENCOUNTER — Telehealth: Payer: Self-pay | Admitting: Cardiology

## 2014-07-09 NOTE — Telephone Encounter (Signed)
Follow Up        Pt returning Heather's phone call.

## 2014-07-09 NOTE — Telephone Encounter (Signed)
The patient is aware of is results.

## 2014-07-21 ENCOUNTER — Ambulatory Visit: Payer: Medicare HMO | Admitting: Pulmonary Disease

## 2014-07-22 ENCOUNTER — Encounter: Payer: Self-pay | Admitting: Family Medicine

## 2014-07-22 ENCOUNTER — Ambulatory Visit (INDEPENDENT_AMBULATORY_CARE_PROVIDER_SITE_OTHER): Payer: Medicare HMO | Admitting: Family Medicine

## 2014-07-22 VITALS — BP 163/84 | HR 76 | Temp 97.8°F | Resp 18 | Ht 68.0 in | Wt 254.2 lb

## 2014-07-22 DIAGNOSIS — E785 Hyperlipidemia, unspecified: Secondary | ICD-10-CM

## 2014-07-22 DIAGNOSIS — I10 Essential (primary) hypertension: Secondary | ICD-10-CM

## 2014-07-22 DIAGNOSIS — Z1212 Encounter for screening for malignant neoplasm of rectum: Secondary | ICD-10-CM

## 2014-07-22 DIAGNOSIS — Z1211 Encounter for screening for malignant neoplasm of colon: Secondary | ICD-10-CM

## 2014-07-22 MED ORDER — ROSUVASTATIN CALCIUM 20 MG PO TABS: 20.0000 mg | ORAL_TABLET | Freq: Every day | ORAL | Status: DC

## 2014-07-22 NOTE — Patient Instructions (Signed)

## 2014-07-24 ENCOUNTER — Encounter: Payer: Self-pay | Admitting: Family Medicine

## 2014-07-24 NOTE — Progress Notes (Signed)
S: This 64 y.o. Male has HTN, reading at [facility] yesterday- 134/85. Pt is compliant w/ medications; home BP readings indicate control. Pt here today for fasting labs and medication refills. He denies diaphoresis, abnormal weight change, fatigue, vision disturbances, CP or tightness, palpitations, edema, SOB or DOE, cough, HA, dizziness, numbness, weakness or syncope. Pt takes rosuvastatin w/o myalgias, abd pain or back pain.  Patient Active Problem List   Diagnosis Date Noted  . Lymphedema of leg 04/16/2012  . OSA (obstructive sleep apnea) 04/10/2012  . Hypertension 02/12/2012  . Melanoma 07/04/2011  . Hyperlipidemia 02/16/2009  . Obesity 02/16/2009  . PERICARDIAL EFFUSION 02/16/2009  . ALLERGIC RHINITIS CAUSE UNSPECIFIED 02/16/2009  . CHEST PAIN 02/16/2009  . OPEN UNSPECIFIED DISLOCATION OF ELBOW 02/16/2009  . ARTHROSCOPY, RIGHT KNEE, HX OF 02/16/2009    Prior to Admission medications   Medication Sig Start Date End Date Taking? Authorizing Provider  aspirin EC 81 MG tablet Take 1 tablet (81 mg total) by mouth daily. 02/12/12  Yes Larey Dresser, MD  Cholecalciferol (VITAMIN D-3 PO) Take 1,000 Units by mouth daily.    Yes Historical Provider, MD  hydrochlorothiazide (HYDRODIURIL) 25 MG tablet Take 1 tablet (25 mg total) by mouth daily. 06/18/14  Yes Larey Dresser, MD  losartan (COZAAR) 100 MG tablet Take 1 tablet (100 mg total) by mouth daily. 12/03/13  Yes Larey Dresser, MD  Multiple Vitamin (MULTIVITAMIN) capsule Take 1 capsule by mouth daily.   Yes Historical Provider, MD  rosuvastatin (CRESTOR) 20 MG tablet Take 1 tablet (20 mg total) by mouth daily.   Yes Barton Fanny, MD    SURG, SOC and FAM HX reviewed.  ROS: As per HPI.  O: Filed Vitals:   07/22/14 0835  BP: 163/84            BP recheck: 130/80  Pulse: 76  Temp: 97.8 F (36.6 C)  Resp: 18   GEN: In NAD; WN,WD. HENT: [place]?AT; EOMI w/ clear conj/sclerae. Otherwise unremarkable. COR: RRR. LUNGS:  Unlabored resp. SKIN: W&D; intact w/o diaphoresis, jaundice or pallor. NEURO: A&O x 3; CNs intact. Nonfocal.  A/P: Hyperlipidemia- Continue current dose of rosuvastatin 20 mg 1 tab hs.  Hypertension, essential- Stable on current medications.  Screening for colorectal cancer - We discussed CRS at Nov 2015 CPE; pt has not been contacted re: appt w/ GI. I will re-order. Plan: Ambulatory referral to Gastroenterology  Meds ordered this encounter  Medications  . rosuvastatin (CRESTOR) 20 MG tablet    Sig: Take 1 tablet (20 mg total) by mouth daily.    Dispense:  90 tablet    Refill:  1

## 2014-07-27 ENCOUNTER — Encounter: Payer: Self-pay | Admitting: Pulmonary Disease

## 2014-07-27 ENCOUNTER — Ambulatory Visit (INDEPENDENT_AMBULATORY_CARE_PROVIDER_SITE_OTHER): Payer: Medicare HMO | Admitting: Pulmonary Disease

## 2014-07-27 VITALS — BP 130/70 | HR 78 | Temp 97.0°F | Ht 68.0 in | Wt 255.0 lb

## 2014-07-27 DIAGNOSIS — G4733 Obstructive sleep apnea (adult) (pediatric): Secondary | ICD-10-CM

## 2014-07-27 NOTE — Progress Notes (Signed)
   Subjective:    Patient ID: Mark [name], male    DOB: 03/02/1951, 64 y.o.   MRN: [id number]  HPI The patient comes in today for follow-up of his obstructive sleep apnea. He was diagnosed with moderate to severe OSA in 2013, but could not afford a C Pap device. He now has insurance, and would like to start on C Pap. He continues to have significant sleep disruption and daytime sleepiness issues. His weight is actually up 5 pounds from the last visit.   Review of Systems  Constitutional: Negative for fever and unexpected weight change.  HENT: Negative for congestion, dental problem, ear pain, nosebleeds, postnasal drip, rhinorrhea, sinus pressure, sneezing, sore throat and trouble swallowing.   Eyes: Negative for redness and itching.  Respiratory: Negative for cough, chest tightness, shortness of breath and wheezing.   Cardiovascular: Negative for palpitations and leg swelling.  Gastrointestinal: Negative for nausea and vomiting.  Genitourinary: Negative for dysuria.  Musculoskeletal: Negative for joint swelling.  Skin: Negative for rash.  Neurological: Negative for headaches.  Hematological: Does not bruise/bleed easily.  Psychiatric/Behavioral: Negative for dysphoric mood. The patient is not nervous/anxious.        Objective:   Physical Exam Obese male in no acute distress Nose without purulence or discharge noted Neck without lymphadenopathy or thyromegaly Lower extremities with mild edema, no cyanosis Alert and oriented, moves all 4 extremities.       Assessment & Plan:

## 2014-07-27 NOTE — Patient Instructions (Signed)
Will get you started on cpap.  Please call if having tolerance issues. Work on weight loss followup with me again in 8 weeks.

## 2014-07-27 NOTE — Assessment & Plan Note (Signed)
The patient has a history of moderate to severe obstructive sleep apnea, and was not able to afford his C Pap machine at the last visit. He now has insurance, and is continuing to have significant symptoms at night and during the day. I would like to get him started on C Pap, and the patient is agreeable to this approach. I've also encouraged him to work aggressively on weight loss. I will set the patient up on cpap at a moderate pressure level to allow for desensitization, and will troubleshoot the device over the next 4-6weeks if needed.  The pt is to call me if having issues with tolerance.  Will then optimize the pressure once patient is able to wear cpap on a consistent basis.

## 2014-08-28 ENCOUNTER — Encounter: Payer: Self-pay | Admitting: Cardiology

## 2014-08-28 ENCOUNTER — Encounter: Payer: Self-pay | Admitting: Family Medicine

## 2014-08-29 ENCOUNTER — Other Ambulatory Visit: Payer: Self-pay | Admitting: Family Medicine

## 2014-08-29 MED ORDER — ATORVASTATIN CALCIUM 40 MG PO TABS: 40.0000 mg | ORAL_TABLET | Freq: Every day | ORAL | Status: DC

## 2014-08-30 ENCOUNTER — Encounter: Payer: Self-pay | Admitting: Pulmonary Disease

## 2014-08-30 ENCOUNTER — Telehealth: Payer: Self-pay | Admitting: *Deleted

## 2014-08-30 NOTE — Telephone Encounter (Signed)
-----   Message from Larey Dresser, MD sent at 08/30/2014  4:57 PM EDT ----- Regarding: RE: Patient Request He can use atorvastatin 80 mg daily for now. Should be less expensive.  ----- Message -----    From: Antonieta Iba, RN    Sent: 08/30/2014   8:36 AM      To: Larey Dresser, MD Subject: Patient Request                                Good Evening Dr. Aundra Dubin, I had order my prescription refill for my Crestor and I was told by my [place] that this would cost me $192 for my part and I am on disability and I just cant afford this price of medicine and I have been paying $18 per 30 day bottle and I don't know if you have generic brand you can suggest to me or you have any samples but I need my medicine I am out as of 08/2314. I appreciate it if you could write me a generic or any Crestor Samples.  Sincerely, Ronnie Kasik  #we do not have any Crestor samples at this time, however we expect the generic to become available this Spring#  Nevin Bloodgood

## 2014-08-30 NOTE — Telephone Encounter (Signed)
Patient had been taking Crestor but it was price prohibitive. He contacted Dr. Aundra Dubin for the purpose of getting a different medication ordered that would be less costly. Patient had also reached out to his PCP. She ordered Atorvastatin 40 mg daily on 08/29/14. Dr. Aundra Dubin had seen the patient's request and ordered Atorvastatin 80 mg daily on 08/30/14. Triage nurse attempted to call patient to discuss however patient not available - LMTCB.

## 2014-08-30 NOTE — Telephone Encounter (Signed)
lmtcb

## 2014-08-31 MED ORDER — ROSUVASTATIN CALCIUM 20 MG PO TABS: 20.0000 mg | ORAL_TABLET | Freq: Every day | ORAL | Status: DC

## 2014-08-31 NOTE — Telephone Encounter (Signed)
Patient called back to inform us that he is going to stay on Crestor. He found out that the reason it was so expensive this month is because a new benefit year began and he needed to meet his deductible.

## 2014-08-31 NOTE — Telephone Encounter (Signed)
Spoke to patient regarding the dose that Dr. Aundra Dubin ordered for him regarding switching to Atorvastatin from Crestor. Patient's PCP, Dr. Leward Quan put him on Atorvastatin 40 mg daily. Dr. Aundra Dubin ordered Atorvastatin 80 mg daily. Patient stated he would do as Dr. Aundra Dubin and began taking the higher daily dosage. Verified pharmacy patient uses. Patient would like to call [employer] first before picking up prescription to see why they won't cover the Crestor. Patient will call back.

## 2014-08-31 NOTE — Telephone Encounter (Signed)
F/u    Pt calling your back from previous message.

## 2014-08-31 NOTE — Telephone Encounter (Signed)
Fine to stay on Crestor

## 2014-09-01 NOTE — Telephone Encounter (Signed)
Spoke with patient and advised ok

## 2014-09-02 NOTE — Telephone Encounter (Signed)
appt has been made 09/28/14 at 10:15am with DR. Clance

## 2014-09-21 ENCOUNTER — Ambulatory Visit: Payer: Medicare HMO | Admitting: Pulmonary Disease

## 2014-09-28 ENCOUNTER — Ambulatory Visit: Payer: Medicare HMO | Admitting: Pulmonary Disease

## 2014-10-20 ENCOUNTER — Ambulatory Visit: Payer: Medicare HMO | Admitting: Pulmonary Disease

## 2014-10-24 ENCOUNTER — Telehealth: Payer: Self-pay

## 2014-10-24 NOTE — Telephone Encounter (Signed)
Pt would like a call back concerning his leg. His leg is red and swollen. Please advise at [phone]

## 2014-10-25 NOTE — Telephone Encounter (Signed)
Left message for pt to call back  °

## 2014-10-26 NOTE — Telephone Encounter (Signed)
Called pt again, advised to RTC. He states he went to [facility] for evaluation. He ws diagnosed with cellulitis and he is doing better.

## 2014-10-28 ENCOUNTER — Ambulatory Visit: Payer: Medicare HMO | Admitting: Family Medicine

## 2014-10-29 ENCOUNTER — Ambulatory Visit: Payer: Medicare HMO | Admitting: Pulmonary Disease

## 2014-11-03 ENCOUNTER — Ambulatory Visit (INDEPENDENT_AMBULATORY_CARE_PROVIDER_SITE_OTHER): Payer: Medicare HMO | Admitting: Family Medicine

## 2014-11-03 ENCOUNTER — Encounter: Payer: Self-pay | Admitting: Family Medicine

## 2014-11-03 VITALS — BP 151/88 | HR 76 | Temp 98.3°F | Resp 16 | Ht 68.0 in | Wt 259.4 lb

## 2014-11-03 DIAGNOSIS — L03115 Cellulitis of right lower limb: Secondary | ICD-10-CM | POA: Diagnosis not present

## 2014-11-03 NOTE — Progress Notes (Signed)
   Subjective:    Patient ID: Mark [name], male    DOB: 1950/11/11, 64 y.o.   MRN: [id number]  HPI This is a very pleasant 64 yo male who presents today for ER follow up. He was seen at [facility] 10/24/14 with right leg cellulitis. He was started on Clindamyacin and has 1 day left. 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 He reports the swelling has gone down significantly, but he still has some beyond his baseline which is worse as the day progresses. He is unable to wear his compression stocking with the current swelling. He is able to wear his regular shoes and he has returned to work as an Herbalist.   He is followed by cardiology for HTN and hyperlipidemia  Review of Systems No fever or chills, no chest pain or SOB. No redness of leg. Little pain. No fatigue.     Objective:   Physical Exam Physical Exam  Constitutional: Oriented to person, place, and time. He appears well-developed and well-nourished.  HENT:  Head: Normocephalic and atraumatic.  Eyes: Conjunctivae are normal.  Neck: Normal range of motion. Neck supple.  Cardiovascular: Normal rate, regular rhythm and normal heart sounds.   Pulmonary/Chest: Effort normal and breath sounds normal.  Musculoskeletal: Normal range of motion.  Neurological: Alert and oriented to person, place, and time.  Skin: Skin is warm and dry.  Psychiatric: Normal mood and affect. Behavior is normal. Judgment and thought content normal.  Right lower leg larger than left. Measurement 6 cm below popliteal fossa was 22 1/4 cm. Trace pretibial edema bilaterally. No erythema. No tenderness.  Vitals reviewed.  BP 151/88 mmHg  Pulse 76  Temp(Src) 98.3 F (36.8 C) (Oral)  Resp 16  Ht 5\' 8"  (1.727 m)  Wt 259 lb 6.4 oz (117.663 kg)  BMI 39.45 kg/m2  SpO2 97% Blood pressure recheck 148/82    Assessment & Plan:  1. Cellulitis of leg, right - improving on clindamycin - encouraged him to use an ace wrap until he can fit  back into his compression stocking and to elevate when ever he sits - RTC if increased swelling, redness or pain occurs - follow up in 6 months for CPE   Clarene Reamer, FNP-BC  [facility]  11/06/2014 12:59 AM

## 2014-11-03 NOTE — Patient Instructions (Signed)
Use an ace wrap on your leg Keep elevated as much as possible Return to clinic if you develop a fever, pain, redness or increased swelling.

## 2014-12-20 ENCOUNTER — Other Ambulatory Visit: Payer: Self-pay | Admitting: Cardiology

## 2015-01-24 ENCOUNTER — Ambulatory Visit: Payer: Medicare HMO | Admitting: Internal Medicine

## 2015-03-03 LAB — HM COLONOSCOPY: HM Colonoscopy: 4

## 2015-03-30 ENCOUNTER — Encounter: Payer: Self-pay | Admitting: Family Medicine

## 2015-04-26 ENCOUNTER — Ambulatory Visit (INDEPENDENT_AMBULATORY_CARE_PROVIDER_SITE_OTHER): Payer: Medicare HMO | Admitting: Family Medicine

## 2015-04-26 ENCOUNTER — Encounter: Payer: Self-pay | Admitting: Family Medicine

## 2015-04-26 VITALS — BP 130/82 | HR 71 | Temp 97.8°F | Resp 16 | Ht 68.0 in | Wt 260.0 lb

## 2015-04-26 DIAGNOSIS — I10 Essential (primary) hypertension: Secondary | ICD-10-CM

## 2015-04-26 DIAGNOSIS — E785 Hyperlipidemia, unspecified: Secondary | ICD-10-CM

## 2015-04-26 DIAGNOSIS — Z6839 Body mass index (BMI) 39.0-39.9, adult: Secondary | ICD-10-CM

## 2015-04-26 DIAGNOSIS — Z125 Encounter for screening for malignant neoplasm of prostate: Secondary | ICD-10-CM

## 2015-04-26 DIAGNOSIS — K219 Gastro-esophageal reflux disease without esophagitis: Secondary | ICD-10-CM | POA: Diagnosis not present

## 2015-04-26 DIAGNOSIS — R350 Frequency of micturition: Secondary | ICD-10-CM | POA: Diagnosis not present

## 2015-04-26 DIAGNOSIS — Z0000 Encounter for general adult medical examination without abnormal findings: Secondary | ICD-10-CM | POA: Diagnosis not present

## 2015-04-26 LAB — TSH: TSH: 2.163 u[IU]/mL (ref 0.350–4.500)

## 2015-04-26 LAB — COMPREHENSIVE METABOLIC PANEL
ALT: 17 U/L (ref 9–46)
AST: 16 U/L (ref 10–35)
Albumin: 3.9 g/dL (ref 3.6–5.1)
Alkaline Phosphatase: 82 U/L (ref 40–115)
BILIRUBIN TOTAL: 0.6 mg/dL (ref 0.2–1.2)
BUN: 13 mg/dL (ref 7–25)
CO2: 29 mmol/L (ref 20–31)
Calcium: 9.2 mg/dL (ref 8.6–10.3)
Chloride: 103 mmol/L (ref 98–110)
Creat: 0.86 mg/dL (ref 0.70–1.25)
GLUCOSE: 91 mg/dL (ref 65–99)
Potassium: 4.2 mmol/L (ref 3.5–5.3)
Sodium: 139 mmol/L (ref 135–146)
Total Protein: 6.3 g/dL (ref 6.1–8.1)

## 2015-04-26 LAB — LIPID PANEL
CHOL/HDL RATIO: 4.2 ratio (ref <=5.0)
Cholesterol: 142 mg/dL (ref 125–200)
HDL: 34 mg/dL — ABNORMAL LOW (ref >=40)
LDL CALC: 80 mg/dL (ref <130)
Triglycerides: 141 mg/dL (ref <150)
VLDL: 28 mg/dL (ref <30)

## 2015-04-26 LAB — CBC
HCT: 39.4 % (ref 39.0–52.0)
Hemoglobin: 13 g/dL (ref 13.0–17.0)
MCH: 28.3 pg (ref 26.0–34.0)
MCHC: 33 g/dL (ref 30.0–36.0)
MCV: 85.8 fL (ref 78.0–100.0)
MPV: 9.3 fL (ref 8.6–12.4)
PLATELETS: 214 10*3/uL (ref 150–400)
RBC: 4.59 MIL/uL (ref 4.22–5.81)
RDW: 14.3 % (ref 11.5–15.5)
WBC: 6.4 10*3/uL (ref 4.0–10.5)

## 2015-04-26 LAB — HEMOGLOBIN A1C
HEMOGLOBIN A1C: 6 % — AB (ref <5.7)
MEAN PLASMA GLUCOSE: 126 mg/dL — AB (ref <117)

## 2015-04-26 MED ORDER — OMEPRAZOLE 20 MG PO CPDR: 20.0000 mg | DELAYED_RELEASE_CAPSULE | Freq: Every day | ORAL | Status: DC

## 2015-04-26 NOTE — Progress Notes (Signed)
Subjective:    Patient ID: Mark [name], male    DOB: Dec 14, 1950, 64 y.o.   MRN: [id number]  HPI This is a pleasant 64 yo male who presents for Annual Wellness Visit. 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  He sees Dr. Gwenette Greet for his OSA. He sees Dr. Aundra Dubin for his HTN, hypercholesterolemia. Sees Dermatology regularly for follow up of melanoma.   Last CPE- 04/22/14 PSA- 04/22/14 Colonoscopy- 03/03/15 Tdap- 03/19/13 Flu- declines Zoster/pneumonia- he will think about these and we will discuss further at next visit Dental- not regular Eye- is planning on scheduling soon Exercise- none  Eats a lot of sandwiches, drinks mostly water, eats some fast food.   Past Medical History  Diagnosis Date  . Other and unspecified hyperlipidemia   . Obesity, unspecified   . Unspecified disease of pericardium   . Allergic rhinitis, cause unspecified   . Dyspnea   . Chest pain   . Melanoma ([place]) 01/19/2011  . Kidney stones last 1995  . Sleep disturbance, unspecified   . Rib fractures     2 through 10  . Open unspecified dislocation of elbow   . Shortness of breath    Past Surgical History  Procedure Laterality Date  . Melanoma excision with sentinel lymph node biopsy    . Lymph node excision right groin    . Knee arthroscopy      Right  . Open unspecified dislocation of elbow  2006    R arm crush injury, irrigation and debridment   Family History  Problem Relation Age of Onset  . Lung cancer Mother 15  . Cancer Mother   . Lung cancer Father 65  . Cancer Father   . Lung cancer Maternal Grandmother 60    Lung cancer  . Heart disease Maternal Grandfather   . Heart disease Paternal Grandfather    Social History  Substance Use Topics  . Smoking status: Never Smoker   . Smokeless tobacco: None  . Alcohol Use: Yes     Comment: occ beer/wine    Review of Systems  Constitutional: Positive for fatigue (when sitting for a long time).  HENT: Positive for trouble  swallowing (with solids, doesn't get "hung up," chews carefully.Happens weekly. ).   Eyes: Positive for discharge, itching and visual disturbance.  Respiratory: Positive for shortness of breath (With climbing stairs, none with normal activities).   Endocrine: Negative.   Genitourinary: Positive for urgency, frequency (just at night, 4-5 times a night. Voids a couple of times during the day. ) and decreased urine volume (stream strong, some dripping at the end of voiding.).  Musculoskeletal: Negative.   Skin: Negative.   Allergic/Immunologic: Negative.   Neurological: Negative.   Hematological: Negative.   Psychiatric/Behavioral: Negative.       Objective:   Physical Exam  Constitutional: He is oriented to person, place, and time. He appears well-developed and well-nourished.  obese  HENT:  Head: Normocephalic and atraumatic.  Right Ear: External ear normal.  Left Ear: External ear normal.  Nose: Nose normal.  Mouth/Throat: Oropharynx is clear and moist. No oropharyngeal exudate.  Eyes: Conjunctivae are normal. Pupils are equal, round, and reactive to light.  Neck: Normal range of motion. Neck supple. No JVD present. Carotid bruit is not present. No thyromegaly present.  Cardiovascular: Normal rate and normal heart sounds.   Pulmonary/Chest: Effort normal and breath sounds normal.  Abdominal: Soft. Bowel sounds are normal. He exhibits no distension and no mass. There  is no tenderness. There is no rebound and no guarding.  Genitourinary: Rectum normal, prostate normal and penis normal.  Musculoskeletal: Normal range of motion. He exhibits no edema.  Lymphadenopathy:    He has no cervical adenopathy.  Neurological: He is alert and oriented to person, place, and time. He has normal reflexes.  Skin: Skin is warm and dry.  Psychiatric: He has a normal mood and affect. His behavior is normal. Judgment and thought content normal.  Vitals reviewed.  BP 130/82 mmHg  Pulse 71  Temp(Src)  97.8 F (36.6 C) (Oral)  Resp 16  Ht 5\' 8"  (1.727 m)  Wt 260 lb (117.935 kg)  BMI 39.54 kg/m2 Wt Readings from Last 3 Encounters:  04/26/15 260 lb (117.935 kg)  11/03/14 259 lb 6.4 oz (117.663 kg)  07/27/14 255 lb (115.667 kg)   Depression screen [facility] 2/9 04/26/2015 11/03/2014 04/22/2014  Decreased Interest 0 0 0  Down, Depressed, Hopeless 0 0 0  PHQ - 2 Score 0 0 0      Assessment & Plan:  1. Annual physical exam - discussed and encouraged healthy behaviors- regular exercise, regular meals, good sleep habits, eye/dental exams needed  2. Hyperlipidemia - Lipid panel  3. Hypertension, essential - CBC - Comprehensive metabolic panel  4. Screening for prostate cancer - PSA, Medicare  5. BMI 39.0-39.9,adult - Discussed need for weight loss and encouraged him to increase his activity and discussed dietary modifications for weight loss - TSH - Hemoglobin A1c  6. Urinary frequency - POCT urinalysis dipstick - he does not seem to void much during the day, not sure if this is related to driving and sitting and not voiding as often as he should. Encouraged him to drink more fluids early in the day and void regularly, making stops when driving as needed. If no improvement, will refer to urology.   7. Gastroesophageal reflux disease, esophagitis presence not specified - omeprazole (PRILOSEC) 20 MG capsule; Take 1 capsule (20 mg total) by mouth daily.  Dispense: 30 capsule; Refill: 3 - if no improvement in swallowing, he will need to go back to see Dr. Benson Norway.  - follow up 3 months  Clarene Reamer, FNP-BC  [facility]  04/29/2015 10:42 AM

## 2015-04-26 NOTE — Patient Instructions (Signed)
Please start taking the omeprazole for acid reflux, if your symptoms get worse, you will need to go back to see Dr. Benson Norway  Try to maintain your weight over the holidays, then decrease your portions, increase your vegetable and watch your starchy food intake.      Why follow it? Research shows. . Those who follow the Mediterranean diet have a reduced risk of heart disease  . The diet is associated with a reduced incidence of Parkinson's and Alzheimer's diseases . People following the diet may have longer life expectancies and lower rates of chronic diseases  . The Dietary Guidelines for Americans recommends the Mediterranean diet as an eating plan to promote health and prevent disease  What Is the Mediterranean Diet?  . Healthy eating plan based on typical foods and recipes of Mediterranean-style cooking . The diet is primarily a plant based diet; these foods should make up a majority of meals   Starches - Plant based foods should make up a majority of meals - They are an important sources of vitamins, minerals, energy, antioxidants, and fiber - Choose whole grains, foods high in fiber and minimally processed items  - Typical grain sources include wheat, oats, barley, corn, brown rice, bulgar, farro, millet, polenta, couscous  - Various types of beans include chickpeas, lentils, fava beans, black beans, white beans   Fruits  Veggies - Large quantities of antioxidant rich fruits & veggies; 6 or more servings  - Vegetables can be eaten raw or lightly drizzled with oil and cooked  - Vegetables common to the traditional Mediterranean Diet include: artichokes, arugula, beets, broccoli, brussel sprouts, cabbage, carrots, celery, collard greens, cucumbers, eggplant, kale, leeks, lemons, lettuce, mushrooms, okra, onions, peas, peppers, potatoes, pumpkin, radishes, rutabaga, shallots, spinach, sweet potatoes, turnips, zucchini - Fruits common to the Mediterranean Diet include: apples, apricots, avocados,  cherries, clementines, dates, figs, grapefruits, grapes, melons, nectarines, oranges, peaches, pears, pomegranates, strawberries, tangerines  Fats - Replace butter and margarine with healthy oils, such as olive oil, canola oil, and tahini  - Limit nuts to no more than a handful a day  - Nuts include walnuts, almonds, pecans, pistachios, pine nuts  - Limit or avoid candied, honey roasted or heavily salted nuts - Olives are central to the [employer] - can be eaten whole or used in a variety of dishes   Meats Protein - Limiting red meat: no more than a few times a month - When eating red meat: choose lean cuts and keep the portion to the size of deck of cards - Eggs: approx. 0 to 4 times a week  - Fish and lean poultry: at least 2 a week  - Healthy protein sources include, chicken, [place], lean beef, lamb - Increase intake of seafood such as tuna, salmon, trout, mackerel, shrimp, scallops - Avoid or limit high fat processed meats such as sausage and bacon  Dairy - Include moderate amounts of low fat dairy products  - Focus on healthy dairy such as fat free yogurt, skim milk, low or reduced fat cheese - Limit dairy products higher in fat such as whole or 2% milk, cheese, ice cream  Alcohol - Moderate amounts of red wine is ok  - No more than 5 oz daily for women (all ages) and men older than age 30  - No more than 10 oz of wine daily for men younger than 41  Other - Limit sweets and other desserts  - Use herbs and spices instead of salt to  flavor foods  - Herbs and spices common to the traditional Mediterranean Diet include: basil, bay leaves, chives, cloves, cumin, fennel, garlic, lavender, marjoram, mint, oregano, parsley, pepper, rosemary, sage, savory, sumac, tarragon, thyme   It's not just a diet, it's a lifestyle:  . The Mediterranean diet includes lifestyle factors typical of those in the region  . Foods, drinks and meals are best eaten with others and savored . Daily physical  activity is important for overall good health . This could be strenuous exercise like running and aerobics . This could also be more leisurely activities such as walking, housework, yard-work, or taking the stairs . Moderation is the key; a balanced and healthy diet accommodates most foods and drinks . Consider portion sizes and frequency of consumption of certain foods   Meal Ideas & Options:  . Breakfast:  o Whole wheat toast or whole wheat English muffins with peanut butter & hard boiled egg o Steel cut oats topped with apples & cinnamon and skim milk  o Fresh fruit: banana, strawberries, melon, berries, peaches  o Smoothies: strawberries, bananas, greek yogurt, peanut butter o Low fat greek yogurt with blueberries and granola  o Egg white omelet with spinach and mushrooms o Breakfast couscous: whole wheat couscous, apricots, skim milk, cranberries  . Sandwiches:  o Hummus and grilled vegetables (peppers, zucchini, squash) on whole wheat bread   o Grilled chicken on whole wheat pita with lettuce, tomatoes, cucumbers or tzatziki  o Tuna salad on whole wheat bread: tuna salad made with greek yogurt, olives, red peppers, capers, green onions o Garlic rosemary lamb pita: lamb sauted with garlic, rosemary, salt & pepper; add lettuce, cucumber, greek yogurt to pita - flavor with lemon juice and black pepper  . Seafood:  o Mediterranean grilled salmon, seasoned with garlic, basil, parsley, lemon juice and black pepper o Shrimp, lemon, and spinach whole-grain pasta salad made with low fat greek yogurt  o Seared scallops with lemon orzo  o Seared tuna steaks seasoned salt, pepper, coriander topped with tomato mixture of olives, tomatoes, olive oil, minced garlic, parsley, green onions and cappers  . Meats:  o Herbed greek chicken salad with kalamata olives, cucumber, feta  o Red bell peppers stuffed with spinach, bulgur, lean ground beef (or lentils) & topped with feta   o Kebabs: skewers of  chicken, tomatoes, onions, zucchini, squash  o [place] burgers: made with red onions, mint, dill, lemon juice, feta cheese topped with roasted red peppers . Vegetarian o Cucumber salad: cucumbers, artichoke hearts, celery, red onion, feta cheese, tossed in olive oil & lemon juice  o Hummus and whole grain pita points with a greek salad (lettuce, tomato, feta, olives, cucumbers, red onion) o Lentil soup with celery, carrots made with vegetable broth, garlic, salt and pepper  o Tabouli salad: parsley, bulgur, mint, scallions, cucumbers, tomato, radishes, lemon juice, olive oil, salt and pepper.       Gastroesophageal Reflux Disease, Adult Normally, food travels down the esophagus and stays in the stomach to be digested. However, when a person has gastroesophageal reflux disease (GERD), food and stomach acid move back up into the esophagus. When this happens, the esophagus becomes sore and inflamed. Over time, GERD can create small holes (ulcers) in the lining of the esophagus.  CAUSES This condition is caused by a problem with the muscle between the esophagus and the stomach (lower esophageal sphincter, or LES). Normally, the LES muscle closes after food passes through the esophagus to the stomach. When  the LES is weakened or abnormal, it does not close properly, and that allows food and stomach acid to go back up into the esophagus. The LES can be weakened by certain dietary substances, medicines, and medical conditions, including:  Tobacco use.  Pregnancy.  Having a hiatal hernia.  Heavy alcohol use.  Certain foods and beverages, such as coffee, chocolate, onions, and peppermint. RISK FACTORS This condition is more likely to develop in:  People who have an increased body weight.  People who have connective tissue disorders.  People who use NSAID medicines. SYMPTOMS Symptoms of this condition include:  Heartburn.  Difficult or painful swallowing.  The feeling of having a lump in  the throat.  Abitter taste in the mouth.  Bad breath.  Having a large amount of saliva.  Having an upset or bloated stomach.  Belching.  Chest pain.  Shortness of breath or wheezing.  Ongoing (chronic) cough or a night-time cough.  Wearing away of tooth enamel.  Weight loss. Different conditions can cause chest pain. Make sure to see your health care provider if you experience chest pain. DIAGNOSIS Your health care provider will take a medical history and perform a physical exam. To determine if you have mild or severe GERD, your health care provider may also monitor how you respond to treatment. You may also have other tests, including:  An endoscopy toexamine your stomach and esophagus with a small camera.  A test thatmeasures the acidity level in your esophagus.  A test thatmeasures how much pressure is on your esophagus.  A barium swallow or modified barium swallow to show the shape, size, and functioning of your esophagus. TREATMENT The goal of treatment is to help relieve your symptoms and to prevent complications. Treatment for this condition may vary depending on how severe your symptoms are. Your health care provider may recommend:  Changes to your diet.  Medicine.  Surgery. HOME CARE INSTRUCTIONS Diet  Follow a diet as recommended by your health care provider. This may involve avoiding foods and drinks such as:  Coffee and tea (with or without caffeine).  Drinks that containalcohol.  Energy drinks and sports drinks.  Carbonated drinks or sodas.  Chocolate and cocoa.  Peppermint and mint flavorings.  Garlic and onions.  Horseradish.  Spicy and acidic foods, including peppers, chili powder, curry powder, vinegar, hot sauces, and barbecue sauce.  Citrus fruit juices and citrus fruits, such as oranges, lemons, and limes.  Tomato-based foods, such as red sauce, chili, salsa, and pizza with red sauce.  Fried and fatty foods, such as donuts,  french fries, potato chips, and high-fat dressings.  High-fat meats, such as hot dogs and fatty cuts of red and white meats, such as rib eye steak, sausage, ham, and bacon.  High-fat dairy items, such as whole milk, butter, and cream cheese.  Eat small, frequent meals instead of large meals.  Avoid drinking large amounts of liquid with your meals.  Avoid eating meals during the 2-3 hours before bedtime.  Avoid lying down right after you eat.  Do not exercise right after you eat. General Instructions  Pay attention to any changes in your symptoms.  Take over-the-counter and prescription medicines only as told by your health care provider. Do not take aspirin, ibuprofen, or other NSAIDs unless your health care provider told you to do so.  Do not use any tobacco products, including cigarettes, chewing tobacco, and e-cigarettes. If you need help quitting, ask your health care provider.  Wear loose-fitting clothing.  Do not wear anything tight around your waist that causes pressure on your abdomen.  Raise (elevate) the head of your bed 6 inches (15cm).  Try to reduce your stress, such as with yoga or meditation. If you need help reducing stress, ask your health care provider.  If you are overweight, reduce your weight to an amount that is healthy for you. Ask your health care provider for guidance about a safe weight loss goal.  Keep all follow-up visits as told by your health care provider. This is important. SEEK MEDICAL CARE IF:  You have new symptoms.  You have unexplained weight loss.  You have difficulty swallowing, or it hurts to swallow.  You have wheezing or a persistent cough.  Your symptoms do not improve with treatment.  You have a hoarse voice. SEEK IMMEDIATE MEDICAL CARE IF:  You have pain in your arms, neck, jaw, teeth, or back.  You feel sweaty, dizzy, or light-headed.  You have chest pain or shortness of breath.  You vomit and your vomit looks like  blood or coffee grounds.  You faint.  Your stool is bloody or black.  You cannot swallow, drink, or eat.   This information is not intended to replace advice given to you by your health care provider. Make sure you discuss any questions you have with your health care provider.   Document Released: 02/28/2005 Document Revised: 02/09/2015 Document Reviewed: 09/15/2014 Elsevier Interactive Patient Education [employer].

## 2015-04-27 LAB — PSA, MEDICARE: PSA: 1.79 ng/mL (ref <=4.00)

## 2015-05-19 ENCOUNTER — Ambulatory Visit: Payer: Medicare HMO | Admitting: Internal Medicine

## 2015-06-29 ENCOUNTER — Other Ambulatory Visit: Payer: Self-pay | Admitting: Cardiology

## 2015-07-26 ENCOUNTER — Ambulatory Visit: Payer: Medicare HMO | Admitting: Family Medicine

## 2015-08-08 ENCOUNTER — Ambulatory Visit (INDEPENDENT_AMBULATORY_CARE_PROVIDER_SITE_OTHER): Payer: Medicare HMO | Admitting: Cardiology

## 2015-08-08 ENCOUNTER — Ambulatory Visit: Payer: Medicare HMO | Admitting: Cardiology

## 2015-08-08 ENCOUNTER — Encounter: Payer: Self-pay | Admitting: Cardiology

## 2015-08-08 VITALS — BP 164/98 | HR 74 | Ht 68.0 in | Wt 258.2 lb

## 2015-08-08 DIAGNOSIS — I10 Essential (primary) hypertension: Secondary | ICD-10-CM

## 2015-08-08 DIAGNOSIS — I890 Lymphedema, not elsewhere classified: Secondary | ICD-10-CM | POA: Diagnosis not present

## 2015-08-08 DIAGNOSIS — E785 Hyperlipidemia, unspecified: Secondary | ICD-10-CM

## 2015-08-08 LAB — LIPID PANEL
CHOL/HDL RATIO: 5.1 ratio — AB (ref <=5.0)
CHOLESTEROL: 154 mg/dL (ref 125–200)
HDL: 30 mg/dL — ABNORMAL LOW (ref >=40)
LDL CALC: 83 mg/dL (ref <130)
TRIGLYCERIDES: 207 mg/dL — AB (ref <150)
VLDL: 41 mg/dL — AB (ref <30)

## 2015-08-08 MED ORDER — AMLODIPINE BESYLATE 5 MG PO TABS: 5.0000 mg | ORAL_TABLET | Freq: Every day | ORAL | Status: DC

## 2015-08-08 NOTE — Patient Instructions (Signed)
Your physician has recommended you make the following change in your medication:  1.) START AMLODIPINE (NORVASC) 5 MG FOR YOUR BLOOD PRESSURE  Your physician recommends that you return for lab work in: [place] ([place])  Your physician wants you to follow-up in: [address].   You will receive a reminder letter in the mail two months in advance. If you don't receive a letter, please call our office to schedule the follow-up appointment.  TAKE YOUR BLOOD PRESSURE AND RECORD THE READINGS EVERY DAY FOR THE NEXT FEW WEEKS. WE WILL CALL YOU TO GET THE READINGS, OR YOU CAN SEND THEM THROUGH YOUR MY CHART PORTAL.

## 2015-08-08 NOTE — Progress Notes (Signed)
Patient ID: Mark [name], male   DOB: [birth year], 65 y.o.   MRN: [id number] PCP: Clarene Reamer  65 yo with history of HTN, hyperlipidemia, and melanoma returns for cardiology evaluation.  He has had right leg lymphedema since groin lymph node dissection for melanoma treatment.  BP still runs high.   He has not chest pain.  No dyspnea walking on flat ground, dyspnea carrying heavy loads.  His weight is up 14 lbs since last appointment. No lightheadedness or syncope.  No palpitations.  Does not get much exercise.   ECG: NSR, nonspecific T wave flattening  Labs (2/13): LDL 104, HDL 24 Labs (7/13): K 3.9, creatinine 0.94 Labs (9/13): BNP 22 Labs (10/14): LDL 173, HDL 37, K 3.7, creatinine 0.95 Labs (5/15): LDL 72, HDL 34 Labs (11/15): K 3.8, creatinine 0.91, TSH normal Labs (4/16): K 4.2, creatinine 0.86, HCT 39.4, LDL 80, HDL 34 Labs (3/17): K 3.7, creatinine 1.07  PMH: 1. HTN: ACEI cough 2. Melanoma s/p surgery in 8/12 with groin lymph node dissection.  This resulted in right leg lymphedema. 3. Lymphedema in right leg.  4. Stress test normal in 2006 or 2007. 5. Echo (9/13): EF 55-60%, mildly dilated LV, no significant valvular abnormalities. 6. Hyperlipidemia  7. OSA: Moderate to severe by 2013 sleep study.  Uses CPAP.   SH: Lives in [place] alone.  Has daughter in [place].  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  Nonsmoker.    FH: Grandfather with MI at 62  ROS: All systems reviewed and negative except as per HPI.   Current Outpatient Prescriptions  Medication Sig Dispense Refill  . aspirin EC 81 MG tablet Take 1 tablet (81 mg total) by mouth daily.    . Cholecalciferol (VITAMIN D-3 PO) Take 1,000 Units by mouth daily.     . hydrochlorothiazide (HYDRODIURIL) 25 MG tablet TAKE ONE TABLET BY MOUTH ONCE DAILY 90 tablet 0  . losartan (COZAAR) 100 MG tablet TAKE ONE TABLET BY MOUTH ONCE DAILY 30 tablet 12  . Multiple Vitamin (MULTIVITAMIN) capsule  Take 1 capsule by mouth daily.    . Omega-3 1000 MG CAPS Take 1 g by mouth daily. Take 1 g by mouth daily    . rosuvastatin (CRESTOR) 20 MG tablet Take 1 tablet (20 mg total) by mouth daily. 90 tablet 3  . amLODipine (NORVASC) 5 MG tablet Take 1 tablet (5 mg total) by mouth daily. 30 tablet 11  . [DISCONTINUED] atenolol-chlorthalidone (TENORETIC) 50-25 MG per tablet Take by mouth daily. 1/2 Tablet Q AM     No current facility-administered medications for this visit.    BP 164/98 mmHg  Pulse 74  Ht 5\' 8"  (1.727 m)  Wt 258 lb 4 oz (117.141 kg)  BMI 39.28 kg/m2 General: NAD Neck: Thick, JVP 7 cm, no thyromegaly or thyroid nodule.  Lungs: Clear to auscultation bilaterally with normal respiratory effort. CV: Nondisplaced PMI.  Heart regular S1/S2, no S3/S4, no murmur.  Stable R>L leg swelling.  No carotid bruit.  Normal pedal pulses.  Abdomen: Soft, nontender, no hepatosplenomegaly, no distention.  Skin: Intact without lesions or rashes.  Neurologic: Alert and oriented x 3.  Psych: Normal affect.  Assessment/Plan  1. Hyperlipidemia: Check lipids today.   2. HTN: BP still running high.  Add amlodipine 5 mg daily.  Will check BP daily and call in readings in 2 wks.  Watch for worsening of leg swelling on amlodipine.    3. OSA: Using CPAP.  4. Obesity: Not exercising, weight is up.  I recommended that he walk for 15-20 minutes daily.  Also needs to cut back on portions.  5. Lymphedema: Right leg, occurred s/p lymph node dissection for melenoma. Followed at [facility] 08/08/2015

## 2015-08-23 ENCOUNTER — Ambulatory Visit: Payer: Medicare HMO | Admitting: Internal Medicine

## 2015-08-29 ENCOUNTER — Telehealth: Payer: Self-pay | Admitting: Cardiology

## 2015-08-29 DIAGNOSIS — Z79899 Other long term (current) drug therapy: Secondary | ICD-10-CM

## 2015-08-29 MED ORDER — CARVEDILOL 6.25 MG PO TABS: 6.2500 mg | ORAL_TABLET | Freq: Two times a day (BID) | ORAL | Status: DC

## 2015-08-29 MED ORDER — CHLORTHALIDONE 25 MG PO TABS: 25.0000 mg | ORAL_TABLET | Freq: Every day | ORAL | Status: DC

## 2015-08-29 NOTE — Telephone Encounter (Signed)
Reports BPs still avg 150s/80s.  Since starting Amlodipine he has been experiencing swelling in ankles/feet, below his knee and tingling in feet.  Will forward to Dr. Aundra Dubin for advisement on switching to another BP med. Patient aware we will call him back once received order from [facility].

## 2015-08-29 NOTE — Telephone Encounter (Signed)
I cannot tell if he is still on atenolol/chlorthalidone or not.  If not, he can stop HCTZ and stop amlodipine and start on chlorthalidone 25 mg daily + Coreg 6.25 mg bid with BMET in 10 days.

## 2015-08-29 NOTE — Telephone Encounter (Signed)
Pt is not currently taking atenolol/chlorthalidone. Advised patient to stop HCTZ & Amlodipine. Advised to start Chlorthalidone 25 mg daily and Carvedilol 6.25 mg BID. Reviewed medication instructions with patient. New rx sent to [facility] Follow up lab work scheduled for 4/7. Patient verbalized understanding and agreeable to plan.

## 2015-08-29 NOTE — Telephone Encounter (Signed)
lmtcb

## 2015-08-29 NOTE — Telephone Encounter (Signed)
New message      Pt c/o medication issue:  1. Name of Medication: amlodipine 2. How are you currently taking this medication (dosage and times per day)? 5mg  daily  3. Are you having a reaction (difficulty breathing--STAT)? no 4. What is your medication issue? Having lots of tingling in feet and swelling in feet/ankles and below knee since starting medication

## 2015-09-09 ENCOUNTER — Other Ambulatory Visit: Payer: Medicare HMO

## 2015-09-27 ENCOUNTER — Other Ambulatory Visit: Payer: Self-pay | Admitting: Cardiology

## 2015-10-01 ENCOUNTER — Other Ambulatory Visit: Payer: Self-pay | Admitting: Cardiology

## 2015-10-17 ENCOUNTER — Encounter: Payer: Self-pay | Admitting: Family Medicine

## 2015-10-21 ENCOUNTER — Other Ambulatory Visit: Payer: Self-pay | Admitting: Family Medicine

## 2015-10-21 MED ORDER — HYDROCORTISONE ACETATE 25 MG RE SUPP: 25.0000 mg | Freq: Two times a day (BID) | RECTAL | Status: DC

## 2015-11-11 ENCOUNTER — Ambulatory Visit: Payer: Medicare HMO | Admitting: Cardiology

## 2015-11-17 ENCOUNTER — Ambulatory Visit: Payer: Medicare HMO | Admitting: Internal Medicine

## 2016-02-06 ENCOUNTER — Other Ambulatory Visit: Payer: Self-pay | Admitting: Cardiology

## 2016-08-25 ENCOUNTER — Other Ambulatory Visit: Payer: Self-pay | Admitting: Cardiology

## 2016-08-30 ENCOUNTER — Other Ambulatory Visit: Payer: Self-pay | Admitting: Cardiology

## 2016-09-07 ENCOUNTER — Encounter: Payer: Self-pay | Admitting: Interventional Cardiology

## 2016-09-19 ENCOUNTER — Encounter: Payer: Self-pay | Admitting: Family Medicine

## 2016-09-19 ENCOUNTER — Ambulatory Visit (INDEPENDENT_AMBULATORY_CARE_PROVIDER_SITE_OTHER): Payer: Medicare HMO | Admitting: Family Medicine

## 2016-09-19 VITALS — BP 134/78 | HR 60 | Temp 97.8°F | Resp 16 | Ht 67.5 in | Wt 259.6 lb

## 2016-09-19 DIAGNOSIS — Z125 Encounter for screening for malignant neoplasm of prostate: Secondary | ICD-10-CM | POA: Diagnosis not present

## 2016-09-19 DIAGNOSIS — E782 Mixed hyperlipidemia: Secondary | ICD-10-CM

## 2016-09-19 DIAGNOSIS — R7301 Impaired fasting glucose: Secondary | ICD-10-CM

## 2016-09-19 DIAGNOSIS — I10 Essential (primary) hypertension: Secondary | ICD-10-CM

## 2016-09-19 DIAGNOSIS — Z1159 Encounter for screening for other viral diseases: Secondary | ICD-10-CM

## 2016-09-19 DIAGNOSIS — Z0000 Encounter for general adult medical examination without abnormal findings: Secondary | ICD-10-CM

## 2016-09-19 MED ORDER — ROSUVASTATIN CALCIUM 20 MG PO TABS: 20.0000 mg | ORAL_TABLET | Freq: Every day | ORAL | 2 refills | Status: AC

## 2016-09-19 MED ORDER — CARVEDILOL 6.25 MG PO TABS: 6.2500 mg | ORAL_TABLET | Freq: Two times a day (BID) | ORAL | 3 refills | Status: DC

## 2016-09-19 MED ORDER — CHLORTHALIDONE 25 MG PO TABS: 25.0000 mg | ORAL_TABLET | Freq: Every day | ORAL | 3 refills | Status: DC

## 2016-09-19 MED ORDER — LOSARTAN POTASSIUM 100 MG PO TABS: 100.0000 mg | ORAL_TABLET | Freq: Every day | ORAL | 3 refills | Status: DC

## 2016-09-19 NOTE — Progress Notes (Signed)
QUICK REFERENCE INFORMATION: The ABCs of Providing the Annual Wellness Visit  CMS.gov Medicare Learning Network  [employer] Annual Wellness Visit  Subjective:   Mark [name] is a 66 y.o. Male who presents for an Annual Wellness Visit.  Patient Active Problem List   Diagnosis Date Noted  . Lymphedema of leg 04/16/2012  . OSA (obstructive sleep apnea) 04/10/2012  . Hypertension 02/12/2012  . Melanoma ([place]) 07/04/2011  . Lymphedema 04/25/2011  . Hyperlipidemia 02/16/2009  . Obesity 02/16/2009  . PERICARDIAL EFFUSION 02/16/2009  . ALLERGIC RHINITIS CAUSE UNSPECIFIED 02/16/2009  . CHEST PAIN 02/16/2009  . OPEN UNSPECIFIED DISLOCATION OF ELBOW 02/16/2009  . ARTHROSCOPY, RIGHT KNEE, HX OF 02/16/2009    Past Medical History:  Diagnosis Date  . Allergic rhinitis, cause unspecified   . Chest pain   . Dyspnea   . Kidney stones last 1995  . Melanoma ([place]) 01/19/2011  . Obesity, unspecified   . Open unspecified dislocation of elbow   . Other and unspecified hyperlipidemia   . Rib fractures    2 through 10  . Shortness of breath   . Sleep disturbance, unspecified   . Unspecified disease of pericardium      Past Surgical History:  Procedure Laterality Date  . KNEE ARTHROSCOPY     Right  . lymph node excision right groin    . MELANOMA EXCISION WITH SENTINEL LYMPH NODE BIOPSY    . open unspecified dislocation of elbow  2006   R arm crush injury, irrigation and debridment     Outpatient Medications Prior to Visit  Medication Sig Dispense Refill  . aspirin EC 81 MG tablet Take 1 tablet (81 mg total) by mouth daily.    . Cholecalciferol (VITAMIN D-3 PO) Take 1,000 Units by mouth daily.     . Multiple Vitamin (MULTIVITAMIN) capsule Take 1 capsule by mouth daily.    . Omega-3 1000 MG CAPS Take 1 g by mouth daily. Take 1 g by mouth daily    . carvedilol (COREG) 6.25 MG tablet Take 1 tablet (6.25 mg total) by mouth 2 (two) times daily with a meal. 60 tablet 2  . CRESTOR 20 MG  tablet TAKE ONE TABLET BY MOUTH ONCE DAILY 90 tablet 2  . losartan (COZAAR) 100 MG tablet Take 1 tablet (100 mg total) by mouth daily. *Patient is overdue for an appointment and needs to call and schedule for further refills* 30 tablet 0  . chlorthalidone (HYGROTON) 25 MG tablet TAKE ONE TABLET BY MOUTH ONCE DAILY (Patient not taking: Reported on 09/19/2016) 30 tablet 0  . hydrocortisone (ANUSOL-HC) 25 MG suppository Place 1 suppository (25 mg total) rectally 2 (two) times daily. (Patient not taking: Reported on 09/19/2016) 12 suppository 0   No facility-administered medications prior to visit.     Allergies  Allergen Reactions  . Adhesive [Tape]     blisters  . Lisinopril Cough  . Amoxicillin-Pot Clavulanate Rash  . Latex Rash     Family History  Problem Relation Age of Onset  . Lung cancer Mother 29  . Cancer Mother   . Lung cancer Father 27  . Cancer Father   . Lung cancer Maternal Grandmother 67    Lung cancer  . Heart disease Maternal Grandfather   . Heart disease Paternal Grandfather      Social History   Social History  . Marital status: Divorced    Spouse name: N/A  . Number of children: 1  . Years of education: N/A   Occupational  History  . Doctor, [employer]   Social History Main Topics  . Smoking status: Never Smoker  . Smokeless tobacco: Never Used  . Alcohol use 0.0 oz/week     Comment: occ beer/wine  . Drug use: No  . Sexual activity: Not Asked   Other Topics Concern  . None   Social History Narrative   Exercise walking daily      Recent Hospitalizations? no  Health Habits: Current exercise activities include: none Exercise: 0 times/week. Diet: in general, a "healthy" diet    Alcohol intake: no  Health Risk Assessment: The patient has completed a Health Risk Assessment. This has been reveiwed with them and has been scanned into the [facility] system as an attached document.  Current Medical Providers and  Suppliers: [facility] Patient Care Team: Forrest Moron, MD as PCP - General (Internal Medicine) Future Appointments Date Time Provider [place]  09/25/2016 10:40 AM Jettie Booze, MD [facility]    Age-appropriate Screening Schedule: The list below includes current immunization status and future screening recommendations based on patient's age. Orders for these recommended tests are listed in the plan section. The patient has been provided with a written plan. Immunization History  Administered Date(s) Administered  . Tdap 03/19/2013     Health Maintenance reviewed  - tdap 2014 Flu 2013 Colonoscopy 2016 hiv screen not done Hep c screen not done EKG 08/08/2015 psa 04/26/2015  Depression Screen-PHQ2/9 completed today  Depression screen [facility] 2/9 09/19/2016 04/26/2015 11/03/2014 04/22/2014  Decreased Interest 0 0 0 0  Down, Depressed, Hopeless 0 0 0 0  PHQ - 2 Score 0 0 0 0      Depression Severity and Treatment Recommendations:  0-4= None  5-9= Mild / Treatment: Support, educate to call if worse; return in one month  10-14= Moderate / Treatment: Support, watchful waiting; Antidepressant or Psycotherapy  15-19= Moderately severe / Treatment: Antidepressant OR Psychotherapy  >= 20 = Major depression, severe / Antidepressant AND Psychotherapy  Functional Status Survey: Functional Status Survey: Is the patient deaf or have difficulty hearing?: Yes Does the patient have difficulty seeing, even when wearing glasses/contacts?: No Does the patient have difficulty concentrating, remembering, or making decisions?: No Does the patient have difficulty walking or climbing stairs?: Yes Does the patient have difficulty dressing or bathing?: No Does the patient have difficulty doing errands alone such as visiting a doctor's office or shopping?: No  Is the patient deaf or have difficulty hearing?: Yes Does the patient have difficulty seeing, even when wearing  glasses/contacts?: No Does the patient have difficulty concentrating, remembering, or making decisions?: No Does the patient have difficulty walking or climbing stairs?: Yes Does the patient have difficulty dressing or bathing?: No Does the patient have difficulty doing errands alone such as visiting a doctor's office or shopping?: No   Hearing Evaluation: 1. Do you have trouble hearing the television when others do not? yes 2. Do you have to strain to hear/understand conversations? yes   Advanced Care Planning: 1. Patient has executed an Advance Directive: no  2. If no, patient was given the opportunity to execute an Advance Directive today? yes  3. Are the patient's advanced directives in [place]? no 4. This patient has the ability to prepare an Advance Directive: yes 5. Provider is willing to follow the patient's wishes: yes  Cognitive Assessment: Does the patient have evidence of cognitive impairment? no The patient does not have any evidence of any cognitive problems and denies any  change in mood/affect, appearance, speech, memory or motor skills.  Review of Systems  Constitutional: Negative for chills, fever and weight loss.  Respiratory: Negative for cough and wheezing.   Cardiovascular: Negative for chest pain, palpitations and orthopnea.  Gastrointestinal: Negative for abdominal pain, blood in stool, constipation, diarrhea, nausea and vomiting.  Genitourinary: Negative for dysuria and urgency.  Neurological: Negative for dizziness and headaches.  Psychiatric/Behavioral: Negative for depression.   Objective:   Vitals:   09/19/16 1343 09/19/16 1346  BP: (!) 153/82 134/78  Pulse: 60   Resp: 16   Temp: 97.8 F (36.6 C)   TempSrc: Oral   SpO2: 94%   Weight: 259 lb 9.6 oz (117.8 kg)   Height: 5' 7.5" (1.715 m)     Body mass index is 40.06 kg/m.  Physical Exam  Constitutional: He is oriented to person, place, and time. He appears well-developed and well-nourished.   HENT:  Head: Normocephalic and atraumatic.  Eyes: Conjunctivae and EOM are normal.  Cardiovascular: Normal rate, regular rhythm and normal heart sounds.   No murmur heard. Pulmonary/Chest: Effort normal and breath sounds normal. No respiratory distress. He has no wheezes. He has no rales.  Abdominal: Soft. Bowel sounds are normal. He exhibits no distension. There is no tenderness. There is no rebound.  Genitourinary: Rectum normal and prostate normal. No penile tenderness.  Musculoskeletal: Normal range of motion. He exhibits no edema.  Neurological: He is alert and oriented to person, place, and time. He has normal reflexes.  Psychiatric: He has a normal mood and affect. His behavior is normal. Judgment and thought content normal.     Assessment/Plan:   During the course of the visit the patient was educated and counseled about appropriate screening and preventive services including:  -  Screening for prostate cancer -  Screening for CAD -  Advance directives -  immunizations  Body mass index is 40.06 kg/m. Discussed the patient's BMI with him. The BMI BMI is not in the acceptable range; BMI management plan is completed  Brenda was seen today for annual exam.  Diagnoses and all orders for this visit:  Annual wellness exam  Mixed hyperlipidemia -     Comprehensive metabolic panel -     Lipid panel  Hypertension, essential- bp in good range, will assess renal fx -     Comprehensive metabolic panel -     Microalbumin, urine  Screening for prostate cancer -     PSA  Impaired fasting glucose  Need for hepatitis C screening test -     HCV Ab w/Rflx to Verification  Other orders -     losartan (COZAAR) 100 MG tablet; Take 1 tablet (100 mg total) by mouth daily. -     carvedilol (COREG) 6.25 MG tablet; Take 1 tablet (6.25 mg total) by mouth 2 (two) times daily with a meal. -     rosuvastatin (CRESTOR) 20 MG tablet; Take 1 tablet (20 mg total) by mouth daily. -      chlorthalidone (HYGROTON) 25 MG tablet; Take 1 tablet (25 mg total) by mouth daily.      No Follow-up on file.  Future Appointments Date Time Provider [place]  09/25/2016 10:40 AM Jettie Booze, MD [facility]    Patient Instructions       IF you received an x-ray today, you will receive an invoice from [facility] Radiology. Please contact [facility] Radiology at [phone] with questions or concerns regarding your invoice.   IF you received  labwork today, you will receive an invoice from [facility]. Please contact [facility] at [phone] with questions or concerns regarding your invoice.   Our billing staff will not be able to assist you with questions regarding bills from these companies.  You will be contacted with the lab results as soon as they are available. The fastest way to get your results is to activate your My Chart account. Instructions are located on the last page of this paperwork. If you have not heard from [place] regarding the results in 2 weeks, please contact this office.     Advance Directive Advance directives are legal documents that let you make choices ahead of time about your health care and medical treatment in case you become unable to communicate for yourself. Advance directives are a way for you to communicate your wishes to family, friends, and health care providers. This can help convey your decisions about end-of-life care if you become unable to communicate. Discussing and writing advance directives should happen over time rather than all at once. Advance directives can be changed depending on your situation and what you want, even after you have signed the advance directives. If you do not have an advance directive, some states assign family decision makers to act on your behalf based on how closely you are related to them. Each state has its own laws regarding advance directives. You may want to check with your health care  provider, attorney, or state representative about the laws in your state. There are different types of advance directives, such as:  Medical power of attorney.  Living will.  Do not resuscitate (DNR) or do not attempt resuscitation (DNAR) order. Health care proxy and medical power of attorney A health care proxy, also called a health care agent, is a person who is appointed to make medical decisions for you in cases in which you are unable to make the decisions yourself. Generally, people choose someone they know well and trust to represent their preferences. Make sure to ask this person for an agreement to act as your proxy. A proxy may have to exercise judgment in the event of a medical decision for which your wishes are not known. A medical power of attorney is a legal document that names your health care proxy. Depending on the laws in your state, after the document is written, it may also need to be:  Signed.  Notarized.  Dated.  Copied.  Witnessed.  Incorporated into your medical record. You may also want to appoint someone to manage your financial affairs in a situation in which you are unable to do so. This is called a durable power of attorney for finances. It is a separate legal document from the durable power of attorney for health care. You may choose the same person or someone different from your health care proxy to act as your agent in financial matters. If you do not appoint a proxy, or if there is a concern that the proxy is not acting in your best interests, a court-appointed guardian may be designated to act on your behalf. Living will A living will is a set of instructions documenting your wishes about medical care when you cannot express them yourself. Health care providers should keep a copy of your living will in your medical record. You may want to give a copy to family members or friends. To alert caregivers in case of an emergency, you can place a card in your  wallet to let them know that  you have a living will and where they can find it. A living will is used if you become:  Terminally ill.  Incapacitated.  Unable to communicate or make decisions. Items to consider in your living will include:  The use or non-use of life-sustaining equipment, such as dialysis machines and breathing machines (ventilators).  A DNR or DNAR order, which is the instruction not to use cardiopulmonary resuscitation (CPR) if breathing or heartbeat stops.  The use or non-use of tube feeding.  Withholding of food and fluids.  Comfort (palliative) care when the goal becomes comfort rather than a cure.  Organ and tissue donation. A living will does not give instructions for distributing your money and property if you should pass away. It is recommended that you seek the advice of a lawyer when writing a will. Decisions about taxes, beneficiaries, and asset distribution will be legally binding. This process can relieve your family and friends of any concerns surrounding disputes or questions that may come up about the distribution of your assets. DNR or DNAR A DNR or DNAR order is a request not to have CPR in the event that your heart stops beating or you stop breathing. If a DNR or DNAR order has not been made and shared, a health care provider will try to help any patient whose heart has stopped or who has stopped breathing. If you plan to have surgery, talk with your health care provider about how your DNR or DNAR order will be followed if problems occur. Summary  Advance directives are the legal documents that allow you to make choices ahead of time about your health care and medical treatment in case you become unable to communicate for yourself.  The process of discussing and writing advance directives should happen over time. You can change the advance directives, even after you have signed them.  Advance directives include DNR or DNAR orders, living wills, and  designating an agent as your medical power of attorney. This information is not intended to replace advice given to you by your health care provider. Make sure you discuss any questions you have with your health care provider. Document Released: 08/28/2007 Document Revised: 04/09/2016 Document Reviewed: 04/09/2016 Elsevier Interactive Patient Education  2017 [employer].    An after visit summary with all of these plans was given to the patient.

## 2016-09-19 NOTE — Patient Instructions (Addendum)
   IF you received an x-ray today, you will receive an invoice from [facility]. Please contact [facility] at [phone] with questions or concerns regarding your invoice.   IF you received labwork today, you will receive an invoice from [facility]. Please contact [facility] at [phone] with questions or concerns regarding your invoice.   Our billing staff will not be able to assist you with questions regarding bills from these companies.  You will be contacted with the lab results as soon as they are available. The fastest way to get your results is to activate your My Chart account. Instructions are located on the last page of this paperwork. If you have not heard from us regarding the results in 2 weeks, please contact this office.     Advance Directive Advance directives are legal documents that let you make choices ahead of time about your health care and medical treatment in case you become unable to communicate for yourself. Advance directives are a way for you to communicate your wishes to family, friends, and health care providers. This can help convey your decisions about end-of-life care if you become unable to communicate. Discussing and writing advance directives should happen over time rather than all at once. Advance directives can be changed depending on your situation and what you want, even after you have signed the advance directives. If you do not have an advance directive, some states assign family decision makers to act on your behalf based on how closely you are related to them. Each state has its own laws regarding advance directives. You may want to check with your health care provider, attorney, or state representative about the laws in your state. There are different types of advance directives, such as:  Medical power of attorney.  Living will.  Do not resuscitate (DNR) or do not attempt resuscitation (DNAR) order.  Health care proxy and  medical power of attorney A health care proxy, also called a health care agent, is a person who is appointed to make medical decisions for you in cases in which you are unable to make the decisions yourself. Generally, people choose someone they know well and trust to represent their preferences. Make sure to ask this person for an agreement to act as your proxy. A proxy may have to exercise judgment in the event of a medical decision for which your wishes are not known. A medical power of attorney is a legal document that names your health care proxy. Depending on the laws in your state, after the document is written, it may also need to be:  Signed.  Notarized.  Dated.  Copied.  Witnessed.  Incorporated into your medical record.  You may also want to appoint someone to manage your financial affairs in a situation in which you are unable to do so. This is called a durable power of attorney for finances. It is a separate legal document from the durable power of attorney for health care. You may choose the same person or someone different from your health care proxy to act as your agent in financial matters. If you do not appoint a proxy, or if there is a concern that the proxy is not acting in your best interests, a court-appointed guardian may be designated to act on your behalf. Living will A living will is a set of instructions documenting your wishes about medical care when you cannot express them yourself. Health care providers should keep a copy of your living will in   your medical record. You may want to give a copy to family members or friends. To alert caregivers in case of an emergency, you can place a card in your wallet to let them know that you have a living will and where they can find it. A living will is used if you become:  Terminally ill.  Incapacitated.  Unable to communicate or make decisions.  Items to consider in your living will include:  The use or non-use of  life-sustaining equipment, such as dialysis machines and breathing machines (ventilators).  A DNR or DNAR order, which is the instruction not to use cardiopulmonary resuscitation (CPR) if breathing or heartbeat stops.  The use or non-use of tube feeding.  Withholding of food and fluids.  Comfort (palliative) care when the goal becomes comfort rather than a cure.  Organ and tissue donation.  A living will does not give instructions for distributing your money and property if you should pass away. It is recommended that you seek the advice of a lawyer when writing a will. Decisions about taxes, beneficiaries, and asset distribution will be legally binding. This process can relieve your family and friends of any concerns surrounding disputes or questions that may come up about the distribution of your assets. DNR or DNAR A DNR or DNAR order is a request not to have CPR in the event that your heart stops beating or you stop breathing. If a DNR or DNAR order has not been made and shared, a health care provider will try to help any patient whose heart has stopped or who has stopped breathing. If you plan to have surgery, talk with your health care provider about how your DNR or DNAR order will be followed if problems occur. Summary  Advance directives are the legal documents that allow you to make choices ahead of time about your health care and medical treatment in case you become unable to communicate for yourself.  The process of discussing and writing advance directives should happen over time. You can change the advance directives, even after you have signed them.  Advance directives include DNR or DNAR orders, living wills, and designating an agent as your medical power of attorney. This information is not intended to replace advice given to you by your health care provider. Make sure you discuss any questions you have with your health care provider. Document Released: 08/28/2007 Document  Revised: 04/09/2016 Document Reviewed: 04/09/2016 Elsevier Interactive Patient Education  2017 Elsevier Inc.  

## 2016-09-20 LAB — LIPID PANEL
Chol/HDL Ratio: 4.8 ratio (ref 0.0–5.0)
Cholesterol, Total: 167 mg/dL (ref 100–199)
HDL: 35 mg/dL — ABNORMAL LOW (ref >39)
LDL Calculated: 111 mg/dL — ABNORMAL HIGH (ref 0–99)
Triglycerides: 106 mg/dL (ref 0–149)
VLDL Cholesterol Cal: 21 mg/dL (ref 5–40)

## 2016-09-20 LAB — COMPREHENSIVE METABOLIC PANEL
A/G RATIO: 2.1 (ref 1.2–2.2)
ALT: 17 IU/L (ref 0–44)
AST: 15 IU/L (ref 0–40)
Albumin: 4 g/dL (ref 3.6–4.8)
Alkaline Phosphatase: 80 IU/L (ref 39–117)
BILIRUBIN TOTAL: 0.4 mg/dL (ref 0.0–1.2)
BUN / CREAT RATIO: 14 (ref 10–24)
BUN: 13 mg/dL (ref 8–27)
CHLORIDE: 99 mmol/L (ref 96–106)
CO2: 28 mmol/L (ref 18–29)
Calcium: 9.2 mg/dL (ref 8.6–10.2)
Creatinine, Ser: 0.91 mg/dL (ref 0.76–1.27)
GFR calc Af Amer: 103 mL/min/{1.73_m2} (ref >59)
GFR calc non Af Amer: 89 mL/min/{1.73_m2} (ref >59)
Globulin, Total: 1.9 g/dL (ref 1.5–4.5)
Glucose: 111 mg/dL — ABNORMAL HIGH (ref 65–99)
POTASSIUM: 3.6 mmol/L (ref 3.5–5.2)
SODIUM: 139 mmol/L (ref 134–144)
TOTAL PROTEIN: 5.9 g/dL — AB (ref 6.0–8.5)

## 2016-09-20 LAB — MICROALBUMIN, URINE: MICROALBUM., U, RANDOM: 6.8 ug/mL

## 2016-09-20 LAB — HCV INTERPRETATION

## 2016-09-20 LAB — PSA: PROSTATE SPECIFIC AG, SERUM: 1.3 ng/mL (ref 0.0–4.0)

## 2016-09-20 LAB — HCV AB W/RFLX TO VERIFICATION

## 2016-09-21 ENCOUNTER — Telehealth: Payer: Self-pay | Admitting: Family Medicine

## 2016-09-21 NOTE — Telephone Encounter (Signed)
See results, any concerns?

## 2016-09-21 NOTE — Telephone Encounter (Signed)
Pt would you to give him a call back to go over his labs from [facility] 09/19/16.

## 2016-09-24 NOTE — Progress Notes (Signed)
Cardiology Office Note   Date:  09/25/2016   ID:  Aayan, Haskew [birth year], MRN [id number]  PCP:  Forrest Moron, MD    No chief complaint on file. Follow-up edema   Wt Readings from Last 3 Encounters:  09/25/16 259 lb 9.6 oz (117.8 kg)  09/19/16 259 lb 9.6 oz (117.8 kg)  08/08/15 258 lb 4 oz (117.1 kg)       History of Present Illness: NAREN BENALLY is a 66 y.o. male  with a h/o melanoma diagnosed in 2012,  and lymphedema of the right leg after his lymph node dissection.    The compression stocking on his right leg help with the swelling.  He has OSA.  He no longer uses CPAP.  He did not tolerate it and he did not feel that it helps.  BP has been controlled at home.    He travels for works moving cars.  He has not lost weight.  He is not exercising much.  No dedicated walking.  He tries to eat healthy.  He avoids sugary drinks. He drinks coffee.    Past Medical History:  Diagnosis Date  . Allergic rhinitis, cause unspecified   . Chest pain   . Dyspnea   . Kidney stones last 1995  . Melanoma ([place]) 01/19/2011  . Obesity, unspecified   . Open unspecified dislocation of elbow   . Other and unspecified hyperlipidemia   . Rib fractures    2 through 10  . Shortness of breath   . Sleep disturbance, unspecified   . Unspecified disease of pericardium     Past Surgical History:  Procedure Laterality Date  . KNEE ARTHROSCOPY     Right  . lymph node excision right groin    . MELANOMA EXCISION WITH SENTINEL LYMPH NODE BIOPSY    . open unspecified dislocation of elbow  2006   R arm crush injury, irrigation and debridment     Current Outpatient Prescriptions  Medication Sig Dispense Refill  . aspirin EC 81 MG tablet Take 1 tablet (81 mg total) by mouth daily.    . carvedilol (COREG) 6.25 MG tablet Take 1 tablet (6.25 mg total) by mouth 2 (two) times daily with a meal. 180 tablet 3  . chlorthalidone (HYGROTON) 25 MG tablet Take 1 tablet (25 mg  total) by mouth daily. 90 tablet 3  . Cholecalciferol (VITAMIN D-3 PO) Take 1,000 Units by mouth daily.     . DOCOSAHEXAENOIC ACID PO Take 1 g by mouth.    . losartan (COZAAR) 100 MG tablet Take 1 tablet (100 mg total) by mouth daily. 90 tablet 3  . Multiple Vitamin (MULTIVITAMIN) capsule Take 1 capsule by mouth daily.    . Omega-3 1000 MG CAPS Take 1 g by mouth daily. Take 1 g by mouth daily    . rosuvastatin (CRESTOR) 20 MG tablet Take 1 tablet (20 mg total) by mouth daily. 90 tablet 2   No current facility-administered medications for this visit.     Allergies:   Adhesive [tape]; Lisinopril; Amoxicillin-pot clavulanate; and Latex    Social History:  The patient  reports that he has never smoked. He has never used smokeless tobacco. He reports that he drinks alcohol. He reports that he does not use drugs.   Family History:  The patient's family history includes Cancer in his father and mother; Heart disease in his maternal grandfather and paternal grandfather; Lung cancer (age of onset: 3) in his mother;  Lung cancer (age of onset: 91) in his father; Lung cancer (age of onset: 81) in his maternal grandmother.    ROS:  Please see the history of present illness.   Otherwise, review of systems are positive for Chronic right leg edema.   All other systems are reviewed and negative.    PHYSICAL EXAM: VS:  BP 126/80 (BP Location: Left Arm, Patient Position: Sitting, Cuff Size: Normal)   Pulse 64   Ht 5' 7.5" (1.715 m)   Wt 259 lb 9.6 oz (117.8 kg)   SpO2 98%   BMI 40.06 kg/m  , BMI Body mass index is 40.06 kg/m. GEN: Well nourished, well developed, in no acute distress  HEENT: normal  Neck: no JVD, carotid bruits, or masses Cardiac: RRR; no murmurs, rubs, or gallops,no edema  Respiratory:  clear to auscultation bilaterally, normal work of breathing GI: soft, nontender, nondistended, + BS MS: no deformity or atrophy  Skin: warm and dry, no rash Neuro:  Strength and sensation are  intact Psych: euthymic mood, full affect   EKG:   The ekg ordered today demonstrates normal sinus rhythm, nonspecific ST segment changes   Recent Labs: 09/19/2016: ALT 17; BUN 13; Creatinine, Ser 0.91; Potassium 3.6; Sodium 139   Lipid Panel    Component Value Date/Time   CHOL 167 09/19/2016 1443   TRIG 106 09/19/2016 1443   HDL 35 (L) 09/19/2016 1443   CHOLHDL 4.8 09/19/2016 1443   CHOLHDL 5.1 (H) 08/08/2015 1430   VLDL 41 (H) 08/08/2015 1430   LDLCALC 111 (H) 09/19/2016 1443     Other studies Reviewed: Additional studies/ records that were reviewed today with results demonstrating: Normal ejection fraction by echocardiogram in 2013.   ASSESSMENT AND PLAN:  1. HTN: Blood pressure well controlled. Continue current medications. 2. Hyperlipidemia: Lipids adequately controlled in 2018. Continue current lipid-lowering therapy. 3. Lymphedema: Controlled with compression stockings.  4. OSA: No longer using C.Pap. He is not interested in using it since he did not feel better. 5. Obesity: Stressed the importance of regular exercise and diet control to help lose weight.   Current medicines are reviewed at length with the patient today.  The patient concerns regarding his medicines were addressed.  The following changes have been made:  No change  Labs/ tests ordered today include:  No orders of the defined types were placed in this encounter.   Recommend 150 minutes/week of aerobic exercise Low fat, low carb, high fiber diet recommended  Disposition:   FU in 1 year   Signed, Larae Grooms, MD  09/25/2016 11:20 AM    [facility] [place], [place], [place]  [address] Phone: [phone]; Fax: [phone]

## 2016-09-25 ENCOUNTER — Encounter: Payer: Self-pay | Admitting: Interventional Cardiology

## 2016-09-25 ENCOUNTER — Ambulatory Visit (INDEPENDENT_AMBULATORY_CARE_PROVIDER_SITE_OTHER): Payer: Medicare HMO | Admitting: Interventional Cardiology

## 2016-09-25 VITALS — BP 126/80 | HR 64 | Ht 67.5 in | Wt 259.6 lb

## 2016-09-25 DIAGNOSIS — G4733 Obstructive sleep apnea (adult) (pediatric): Secondary | ICD-10-CM

## 2016-09-25 DIAGNOSIS — E669 Obesity, unspecified: Secondary | ICD-10-CM

## 2016-09-25 DIAGNOSIS — I890 Lymphedema, not elsewhere classified: Secondary | ICD-10-CM | POA: Diagnosis not present

## 2016-09-25 DIAGNOSIS — IMO0001 Reserved for inherently not codable concepts without codable children: Secondary | ICD-10-CM

## 2016-09-25 DIAGNOSIS — E782 Mixed hyperlipidemia: Secondary | ICD-10-CM

## 2016-09-25 DIAGNOSIS — Z6841 Body Mass Index (BMI) 40.0 and over, adult: Secondary | ICD-10-CM | POA: Diagnosis not present

## 2016-09-25 DIAGNOSIS — I10 Essential (primary) hypertension: Secondary | ICD-10-CM

## 2016-09-25 NOTE — Patient Instructions (Signed)

## 2016-09-26 NOTE — Telephone Encounter (Signed)
His results were sent to his mychart. He should continue his current medications. His results look good. Normal kidney function, cholesterol, psa and liver ezymes and he does not have hep c.

## 2016-09-27 NOTE — Telephone Encounter (Signed)
Patient has viewed results on My Chart.

## 2017-11-14 ENCOUNTER — Other Ambulatory Visit: Payer: Self-pay | Admitting: Family Medicine

## 2017-12-03 ENCOUNTER — Other Ambulatory Visit: Payer: Self-pay | Admitting: Family Medicine

## 2017-12-03 NOTE — Telephone Encounter (Signed)
Crestor 20 mg refill request  LOV 09/19/16 with Dr. Nolon Rod.    Needs appt.  [place], [place]

## 2017-12-04 NOTE — Telephone Encounter (Signed)
Refill request for rosuvastatin 20 mg denied.  Pt needs office visit.  Last ov 09/19/16.  Will send to schedulers to call pt and schedule f/u hyperlipidemia /med refill with [facility]. Dgaddy, CMA

## 2017-12-05 ENCOUNTER — Other Ambulatory Visit: Payer: Self-pay | Admitting: Family Medicine

## 2018-01-01 ENCOUNTER — Other Ambulatory Visit: Payer: Self-pay | Admitting: Family Medicine

## 2018-01-02 NOTE — Telephone Encounter (Signed)
Refill request for losartan 100 mg and chlorthalidone 25 mg denied.  Pt last PE on 09/19/16 and last refill on both 11/18/17.  Will send to schedulers to call pt and schedule f/u ov for med refills. Dgaddy, CMA

## 2018-01-03 NOTE — Telephone Encounter (Signed)
Called pt to try and schedule an OV for med refill per CRM and pt stated that he will be finding a new Provider.

## 2018-02-12 ENCOUNTER — Telehealth: Payer: Self-pay

## 2018-02-12 NOTE — Telephone Encounter (Signed)
SENT REFERRAL TO SCHEDULING AND FILED NOTES 

## 2018-02-18 ENCOUNTER — Telehealth: Payer: Self-pay | Admitting: Interventional Cardiology

## 2018-02-18 NOTE — Telephone Encounter (Signed)
I have no problem with a switch but we should let him know that I may not be able to see him for months based on the current schedule. He has a good doctor and I would advise him to continue seeing Dr. Clayton Bibles unless there is a problem. Gerald Stabs

## 2018-02-18 NOTE — Telephone Encounter (Signed)
New message:      Pt would like to switch from [place] to [place]. Please advise on decision and I will call the pt back and schedule if approved.

## 2018-02-18 NOTE — Telephone Encounter (Signed)
OK with me.

## 2018-02-19 NOTE — Telephone Encounter (Signed)
Pt has appointment with Dr. Angelena Form on 03/03/18

## 2018-02-27 ENCOUNTER — Encounter: Payer: Self-pay | Admitting: Cardiovascular Disease

## 2018-03-03 ENCOUNTER — Encounter: Payer: Self-pay | Admitting: Cardiovascular Disease

## 2018-03-03 ENCOUNTER — Ambulatory Visit: Payer: Medicare HMO | Admitting: Cardiovascular Disease

## 2018-03-03 VITALS — BP 120/70 | HR 65 | Ht 67.5 in | Wt 265.0 lb

## 2018-03-03 DIAGNOSIS — E782 Mixed hyperlipidemia: Secondary | ICD-10-CM | POA: Diagnosis not present

## 2018-03-03 DIAGNOSIS — I10 Essential (primary) hypertension: Secondary | ICD-10-CM | POA: Diagnosis not present

## 2018-03-03 NOTE — Progress Notes (Signed)
Chief Complaint  Patient presents with  . Follow-up    HTN   History of Present Illness: 67 yo male with history of melanoma, lymphedema right leg, sleep apnea, HLD and HTN who is here today for follow up. He has been followed by Dr. Irish Lack but is switching to my clinic today. He has been followed in our office for management of HTN. He had seen Dr. Aundra Dubin in the past as well. He has had right leg lymphedema since his groin lymph node dissection for melanoma treatment. Echo September 2013 with normal LV systolic function, VOZD=66-44%.   He tells me today that he has no chest pain or dyspnea. No dizziness. Chronic right leg edema but no left leg edema.   Primary Care Physician: Aletha Halim PA-C   Past Medical History:  Diagnosis Date  . Allergic rhinitis, cause unspecified   . Chest pain   . Dyspnea   . Kidney stones last 1995  . Melanoma ([place]) 01/19/2011  . Obesity, unspecified   . Open unspecified dislocation of elbow   . Other and unspecified hyperlipidemia   . Rib fractures    2 through 10  . Shortness of breath   . Sleep disturbance, unspecified   . Unspecified disease of pericardium     Past Surgical History:  Procedure Laterality Date  . KNEE ARTHROSCOPY     Right  . lymph node excision right groin    . MELANOMA EXCISION WITH SENTINEL LYMPH NODE BIOPSY    . open unspecified dislocation of elbow  2006   R arm crush injury, irrigation and debridment    Current Outpatient Medications  Medication Sig Dispense Refill  . aspirin EC 81 MG tablet Take 1 tablet (81 mg total) by mouth daily.    . carvedilol (COREG) 6.25 MG tablet Take 1 tablet (6.25 mg total) by mouth 2 (two) times daily with a meal. 180 tablet 3  . chlorthalidone (HYGROTON) 25 MG tablet TAKE 1 TABLET BY MOUTH ONCE DAILY 30 tablet 0  . Cholecalciferol (VITAMIN D-3 PO) Take 1,000 Units by mouth daily.     . DOCOSAHEXAENOIC ACID PO Take 1 g by mouth.    . losartan (COZAAR) 100 MG tablet TAKE 1  TABLET BY MOUTH ONCE DAILY 30 tablet 0  . Multiple Vitamin (MULTIVITAMIN) capsule Take 1 capsule by mouth daily.    . Omega-3 1000 MG CAPS Take 1 g by mouth daily. Take 1 g by mouth daily    . rosuvastatin (CRESTOR) 20 MG tablet Take 1 tablet (20 mg total) by mouth daily. 90 tablet 2   No current facility-administered medications for this visit.     Allergies  Allergen Reactions  . Adhesive [Tape]     blisters  . Lisinopril Cough  . Amoxicillin-Pot Clavulanate Rash  . Latex Rash    Social History   Socioeconomic History  . Marital status: Divorced    Spouse name: Not on file  . Number of children: 1  . Years of education: Not on file  . Highest education level: Not on file  Occupational History  . 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 Financial resource strain: Not on file  . Food insecurity:    Worry: Not on file    Inability: Not on file  . Transportation needs:    Medical: Not on file    Non-medical: Not on file  Tobacco Use  . Smoking status: Never Smoker  .  Smokeless tobacco: Never Used  Substance and Sexual Activity  . Alcohol use: Yes    Alcohol/week: 0.0 standard drinks    Comment: occ beer/wine  . Drug use: No  . Sexual activity: Not on file  Lifestyle  . Physical activity:    Days per week: Not on file    Minutes per session: Not on file  . Stress: Not on file  Relationships  . Social connections:    Talks on phone: Not on file    Gets together: Not on file    Attends religious service: Not on file    Active member of club or organization: Not on file    Attends meetings of clubs or organizations: Not on file    Relationship status: Not on file  . Intimate partner violence:    Fear of current or ex partner: Not on file    Emotionally abused: Not on file    Physically abused: Not on file    Forced sexual activity: Not on file  Other Topics Concern  . Not on file  Social History Narrative   Exercise  walking daily    Family History  Problem Relation Age of Onset  . Lung cancer Mother 38  . Cancer Mother   . Lung cancer Father 70  . Cancer Father   . Lung cancer Maternal Grandmother 35       Lung cancer  . Heart disease Maternal Grandfather   . Heart disease Paternal Grandfather     Review of Systems:  As stated in the HPI and otherwise negative.   BP 120/70   Pulse 65   Ht 5' 7.5" (1.715 m)   Wt 265 lb (120.2 kg)   SpO2 95%   BMI 40.89 kg/m   Physical Examination:  General: Well developed, well nourished, NAD  HEENT: OP clear, mucus membranes moist  SKIN: warm, dry. No rashes. Neuro: No focal deficits  Musculoskeletal: Muscle strength 5/5 all ext  Psychiatric: Mood and affect normal  Neck: No JVD, no carotid bruits, no thyromegaly, no lymphadenopathy.  Lungs:Clear bilaterally, no wheezes, rhonci, crackles Cardiovascular: Regular rate and rhythm. No murmurs, gallops or rubs. Abdomen:Soft. Bowel sounds present. Non-tender.  Extremities: No lower extremity edema. Pulses are 2 + in the bilateral DP/PT.  EKG:  EKG is not ordered today. The ekg ordered today demonstrates   Recent Labs: No results found for requested labs within last 8760 hours.   Lipid Panel    Component Value Date/Time   CHOL 167 09/19/2016 1443   TRIG 106 09/19/2016 1443   HDL 35 (L) 09/19/2016 1443   CHOLHDL 4.8 09/19/2016 1443   CHOLHDL 5.1 (H) 08/08/2015 1430   VLDL 41 (H) 08/08/2015 1430   LDLCALC 111 (H) 09/19/2016 1443     Wt Readings from Last 3 Encounters:  03/03/18 265 lb (120.2 kg)  09/25/16 259 lb 9.6 oz (117.8 kg)  09/19/16 259 lb 9.6 oz (117.8 kg)     Other studies Reviewed: Additional studies/ records that were reviewed today include: . Review of the above records demonstrates:    Assessment and Plan:   1. HTN: BP is controlled on current therapy. No changes today  2. Hyperlipidemia: Recent LDL of 90 at [facility] September 2019. Continue statin.   3. Obesity:  Weight loss encouraged.   Current medicines are reviewed at length with the patient today.  The patient does not have concerns regarding medicines.  The following changes have been made:  no change  Labs/  tests ordered today include:  No orders of the defined types were placed in this encounter.    Disposition:   FU with me in 12 months   Signed, Lauree Chandler, MD 03/03/2018 9:25 AM    [facility] [place], [place], [place]  [address] Phone: [phone]; Fax: [phone]

## 2018-03-03 NOTE — Patient Instructions (Signed)

## 2019-03-03 ENCOUNTER — Ambulatory Visit: Payer: Medicare HMO | Admitting: Physician Assistant

## 2019-04-13 NOTE — Progress Notes (Signed)
Cardiology Office Note    Date:  04/15/2019   ID:  Mark, [name] [birth year], MRN [id number]  PCP:  Aletha Halim., PA-C  Cardiologist: Lauree Chandler, MD EPS: None  Chief Complaint  Patient presents with  . Follow-up    History of Present Illness:  Mark [name] is a 68 y.o. male with history of hypertension, HLD, lymphedema of his right leg, OSA, melanoma.  Right leg lymphedema secondary to groin lymph node dissection for melanoma treatment.  Last echo 2013 normal LVEF 55 to 60%.  Patient last saw Dr. Angelena Form 03/03/2018 and doing well no changes made.  Patient comes in for yearly f/u. Transports cars for 8 different companies. No chest pain, dyspnea, dizziness or presyncope. Positive for covid19 02/23/19 and hospitalized at [facility] with pneumonia.Still doesn't have his taste back. Was sick for 18 days. Says he gets tired blowing leaves but no significant. Not aware he was supposed to take coreg twice a day.     Past Medical History:  Diagnosis Date  . Allergic rhinitis, cause unspecified   . Chest pain   . Dyspnea   . Kidney stones last 1995  . Melanoma ([place]) 01/19/2011  . Obesity, unspecified   . Open unspecified dislocation of elbow   . Other and unspecified hyperlipidemia   . Rib fractures    2 through 10  . Shortness of breath   . Sleep disturbance, unspecified   . Unspecified disease of pericardium     Past Surgical History:  Procedure Laterality Date  . KNEE ARTHROSCOPY     Right  . lymph node excision right groin    . MELANOMA EXCISION WITH SENTINEL LYMPH NODE BIOPSY    . open unspecified dislocation of elbow  2006   R arm crush injury, irrigation and debridment    Current Medications: Current Meds  Medication Sig  . aspirin EC 81 MG tablet Take 1 tablet (81 mg total) by mouth daily.  . carvedilol (COREG) 6.25 MG tablet Take 1 tablet (6.25 mg total) by mouth 2 (two) times daily with a meal.  . chlorthalidone  (HYGROTON) 25 MG tablet TAKE 1 TABLET BY MOUTH ONCE DAILY  . Cholecalciferol (VITAMIN D-3 PO) Take 1,000 Units by mouth daily.   . DOCOSAHEXAENOIC ACID PO Take 1 g by mouth.  . losartan (COZAAR) 100 MG tablet TAKE 1 TABLET BY MOUTH ONCE DAILY  . Multiple Vitamin (MULTIVITAMIN) capsule Take 1 capsule by mouth daily.  . Omega-3 1000 MG CAPS Take 1 g by mouth daily. Take 1 g by mouth daily  . rosuvastatin (CRESTOR) 20 MG tablet Take 1 tablet (20 mg total) by mouth daily.     Allergies:   Adhesive [tape], Lisinopril, Amoxicillin-pot clavulanate, and Latex   Social History   Socioeconomic History  . Marital status: Divorced    Spouse name: Not on file  . Number of children: 1  . Years of education: Not on file  . Highest education level: Not on file  Occupational History  . 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 Financial resource strain: Not on file  . Food insecurity    Worry: Not on file    Inability: Not on file  . Transportation needs    Medical: Not on file    Non-medical: Not on file  Tobacco Use  . Smoking status: Never Smoker  . Smokeless tobacco: Never Used  Substance and Sexual Activity  .  Alcohol use: Yes    Alcohol/week: 0.0 standard drinks    Comment: occ beer/wine  . Drug use: No  . Sexual activity: Not on file  Lifestyle  . Physical activity    Days per week: Not on file    Minutes per session: Not on file  . Stress: Not on file  Relationships  . Social Herbalist on phone: Not on file    Gets together: Not on file    Attends religious service: Not on file    Active member of club or organization: Not on file    Attends meetings of clubs or organizations: Not on file    Relationship status: Not on file  Other Topics Concern  . Not on file  Social History Narrative   Exercise walking daily     Family History:  The patient's  family history includes Cancer in his father and mother; Heart disease in  his maternal grandfather and paternal grandfather; Lung cancer (age of onset: 52) in his mother; Lung cancer (age of onset: 78) in his father; Lung cancer (age of onset: 33) in his maternal grandmother.   ROS:   Please see the history of present illness.    ROS All other systems reviewed and are negative.   PHYSICAL EXAM:   VS:  BP 120/72   Pulse 62   Ht 5' 7.5" (1.715 m)   Wt 268 lb 6.4 oz (121.7 kg)   SpO2 95%   BMI 41.42 kg/m   Physical Exam  GEN: Well nourished, well developed, in no acute distress  Neck: no JVD, carotid bruits, or masses Cardiac:RRR; no murmurs, rubs, or gallops  Respiratory:  clear to auscultation bilaterally, normal work of breathing GI: soft, nontender, nondistended, + BS Ext: without cyanosis, clubbing, or edema, Good distal pulses bilaterally Neuro:  Alert and Oriented x 3 Psych: euthymic mood, full affect  Wt Readings from Last 3 Encounters:  04/15/19 268 lb 6.4 oz (121.7 kg)  03/03/18 265 lb (120.2 kg)  09/25/16 259 lb 9.6 oz (117.8 kg)      Studies/Labs Reviewed:   EKG:  EKG is  ordered today.  The ekg ordered today demonstrates NSR no acute change  Recent Labs: No results found for requested labs within last 8760 hours.   Lipid Panel    Component Value Date/Time   CHOL 167 09/19/2016 1443   TRIG 106 09/19/2016 1443   HDL 35 (L) 09/19/2016 1443   CHOLHDL 4.8 09/19/2016 1443   CHOLHDL 5.1 (H) 08/08/2015 1430   VLDL 41 (H) 08/08/2015 1430   LDLCALC 111 (H) 09/19/2016 1443    Additional studies/ records that were reviewed today include:  2D echo 2013 Study Conclusions   - Left ventricle: The cavity size was mildly dilated. Wall    thickness was normal. Systolic function was normal. The    estimated ejection fraction was in the range of 55% to    60%.  - Left atrium: The atrium was mildly dilated.  - Atrial septum: No defect or patent foramen ovale was    identified.  Transthoracic echocardiography.  M-mode, complete 2D,   spectral Doppler, and color Doppler.  Height:  Height:  172.7cm. Height: 68in.  Weight:  Weight: 111.6kg. Weight:  245.5lb.  Body mass index:  BMI: 37.4kg/m^2.  Body surface  area:    BSA: 2.40m^2.  Blood pressure:     140/90.  Patient  status:  Outpatient.  Location:  [facility]   ---------------------  ASSESSMENT:    1. Essential hypertension   2. Mixed hyperlipidemia   3. Morbid obesity ([place])   4. Lymphedema of right lower extremity   5. OSA (obstructive sleep apnea)      PLAN:  In order of problems listed above:  Essential hypertension BP well controlled. Only taking coreg once a day-increase to twice a day  Hyperlipidemia LDL 105 02/12/19 up from 90. Diet and exercise-recheck in 6 months. If still high increase crestor  Obesity exercise and weight loss recommended.  Chronic right leg lymphedema secondary to node dissection for melanoma  OSA not using CPAP-says he can't adjust to it.   Medication Adjustments/Labs and Tests Ordered: Current medicines are reviewed at length with the patient today.  Concerns regarding medicines are outlined above.  Medication changes, Labs and Tests ordered today are listed in the Patient Instructions below. Patient Instructions  Medication Instructions:  Your physician recommends that you continue on your current medications as directed. Please refer to the Current Medication list given to you today.  Be sure to take your carvedilol (coreg) twice a day as prescribed  *If you need a refill on your cardiac medications before your next appointment, please call your pharmacy*  Lab Work: Your physician recommends that you return for a FASTING lipid profile on 10/13/18  If you have labs (blood work) drawn today and your tests are completely normal, you will receive your results only by: Marland Kitchen MyChart Message (if you have MyChart) OR . A paper copy in the mail If you have any lab test that is abnormal or we need to change your  treatment, we will call you to review the results.  Testing/Procedures: None ordered  Follow-Up: At [facility], you and your health needs are our priority.  As part of our continuing mission to provide you with exceptional heart care, we have created designated Provider Care Teams.  These Care Teams include your primary Cardiologist (physician) and Advanced Practice Providers (APPs -  Physician Assistants and Nurse Practitioners) who all work together to provide you with the care you need, when you need it.  Your next appointment:   6 months  The format for your next appointment:   In Person  Provider:   You may see Lauree Chandler, MD or one of the following Advanced Practice Providers on your designated Care Team:    Melina Copa, PA-C  Ermalinda Barrios, PA-C   Other Instructions  Your provider recommends that you maintain 150 minutes per week of moderate aerobic activity.  Your physician encouraged you to lose weight for better health.       Sumner Boast, PA-C  04/15/2019 10:00 AM    [facility] [address], [place], [place]  [address] Phone: [phone]; Fax: [phone]

## 2019-04-15 ENCOUNTER — Encounter: Payer: Self-pay | Admitting: Physician Assistant

## 2019-04-15 ENCOUNTER — Other Ambulatory Visit: Payer: Self-pay

## 2019-04-15 ENCOUNTER — Ambulatory Visit: Payer: Medicare HMO | Admitting: Physician Assistant

## 2019-04-15 VITALS — BP 120/72 | HR 62 | Ht 67.5 in | Wt 268.4 lb

## 2019-04-15 DIAGNOSIS — G4733 Obstructive sleep apnea (adult) (pediatric): Secondary | ICD-10-CM

## 2019-04-15 DIAGNOSIS — I10 Essential (primary) hypertension: Secondary | ICD-10-CM | POA: Diagnosis not present

## 2019-04-15 DIAGNOSIS — I890 Lymphedema, not elsewhere classified: Secondary | ICD-10-CM

## 2019-04-15 DIAGNOSIS — E782 Mixed hyperlipidemia: Secondary | ICD-10-CM | POA: Diagnosis not present

## 2019-04-15 NOTE — Patient Instructions (Addendum)
Medication Instructions:  Your physician recommends that you continue on your current medications as directed. Please refer to the Current Medication list given to you today.  Be sure to take your carvedilol (coreg) twice a day as prescribed  *If you need a refill on your cardiac medications before your next appointment, please call your pharmacy*  Lab Work: Your physician recommends that you return for a FASTING lipid profile on 10/13/19  If you have labs (blood work) drawn today and your tests are completely normal, you will receive your results only by: Marland Kitchen MyChart Message (if you have MyChart) OR . A paper copy in the mail If you have any lab test that is abnormal or we need to change your treatment, we will call you to review the results.  Testing/Procedures: None ordered  Follow-Up: At [facility], you and your health needs are our priority.  As part of our continuing mission to provide you with exceptional heart care, we have created designated Provider Care Teams.  These Care Teams include your primary Cardiologist (physician) and Advanced Practice Providers (APPs -  Physician Assistants and Nurse Practitioners) who all work together to provide you with the care you need, when you need it.  Your next appointment:   6 months  The format for your next appointment:   In Person  Provider:   You may see Lauree Chandler, MD or one of the following Advanced Practice Providers on your designated Care Team:    Melina Copa, PA-C  Ermalinda Barrios, PA-C   Other Instructions  Your provider recommends that you maintain 150 minutes per week of moderate aerobic activity.  Your physician encouraged you to lose weight for better health.

## 2019-07-17 ENCOUNTER — Other Ambulatory Visit: Payer: Self-pay | Admitting: Physician Assistant

## 2019-07-17 MED ORDER — CARVEDILOL 6.25 MG PO TABS: 6.2500 mg | ORAL_TABLET | Freq: Two times a day (BID) | ORAL | 2 refills | Status: AC

## 2019-07-17 NOTE — Telephone Encounter (Signed)
Pt's medication was sent to pt's pharmacy as requested. Confirmation received.  °

## 2019-10-13 ENCOUNTER — Other Ambulatory Visit: Payer: Medicare HMO

## 2019-10-14 ENCOUNTER — Other Ambulatory Visit: Payer: Medicare HMO

## 2019-10-19 ENCOUNTER — Other Ambulatory Visit: Payer: Medicare HMO

## 2019-10-21 ENCOUNTER — Other Ambulatory Visit: Payer: Medicare HMO

## 2019-11-13 ENCOUNTER — Other Ambulatory Visit: Payer: Medicare HMO

## 2019-11-16 ENCOUNTER — Other Ambulatory Visit: Payer: Self-pay

## 2019-11-16 ENCOUNTER — Other Ambulatory Visit: Payer: Medicare HMO | Admitting: *Deleted

## 2019-11-16 DIAGNOSIS — E782 Mixed hyperlipidemia: Secondary | ICD-10-CM

## 2019-11-16 LAB — LIPID PANEL
Chol/HDL Ratio: 4.1 ratio (ref 0.0–5.0)
Cholesterol, Total: 141 mg/dL (ref 100–199)
HDL: 34 mg/dL — ABNORMAL LOW (ref >39)
LDL Chol Calc (NIH): 85 mg/dL (ref 0–99)
Triglycerides: 124 mg/dL (ref 0–149)
VLDL Cholesterol Cal: 22 mg/dL (ref 5–40)

## 2019-11-16 NOTE — Progress Notes (Signed)
Cardiology Office Note    Date:  11/18/2019   ID:  Mark, [name] 04-17-1951, MRN [id number]  PCP:  Aletha Halim., PA-C  Cardiologist: Lauree Chandler, MD EPS: None  Chief Complaint  Patient presents with  . Leg Swelling    History of Present Illness:  Mark [name] is a 69 y.o. male with history of hypertension, HLD, lymphedema of his right leg, OSA, melanoma.  Right leg lymphedema secondary to groin lymph node dissection for melanoma treatment.  Last echo 2013 normal LVEF 55 to 60%.   Patient last saw Dr. Angelena [name] 03/03/2018 and doing well no changes made.  I saw the patient 04/2019 at which time he was still recovering from being hospitalized for Covid pneumonia at [facility] for 18 days.  He was not taking his Coreg twice daily which I asked him to do. Having more trouble with lymphedema. Denies chest pain, dizziness or presynocpe.   Patient comes in for f/u. Gets short of breath if he over exerts himself but thinks it's weight related. 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 Has gained 13 lbs since I last saw him.Eats frozen foods daily and eats out frequently. No regular exercise.   Past Medical History:  Diagnosis Date  . Allergic rhinitis, cause unspecified   . Chest pain   . Dyspnea   . Kidney stones last 1995  . Melanoma ([place]) 01/19/2011  . Obesity, unspecified   . Open unspecified dislocation of elbow   . Other and unspecified hyperlipidemia   . Rib fractures    2 through 10  . Shortness of breath   . Sleep disturbance, unspecified   . Unspecified disease of pericardium     Past Surgical History:  Procedure Laterality Date  . KNEE ARTHROSCOPY     Right  . lymph node excision right groin    . MELANOMA EXCISION WITH SENTINEL LYMPH NODE BIOPSY    . open unspecified dislocation of elbow  2006   R arm crush injury, irrigation and debridment    Current Medications: Current Meds  Medication Sig  . aspirin EC  81 MG tablet Take 1 tablet (81 mg total) by mouth daily.  . carvedilol (COREG) 6.25 MG tablet Take 1 tablet (6.25 mg total) by mouth 2 (two) times daily with a meal.  . chlorthalidone (HYGROTON) 25 MG tablet TAKE 1 TABLET BY MOUTH ONCE DAILY  . Cholecalciferol (VITAMIN D-3 PO) Take 1,000 Units by mouth daily.   . DOCOSAHEXAENOIC ACID PO Take 1 g by mouth.  . losartan (COZAAR) 100 MG tablet TAKE 1 TABLET BY MOUTH ONCE DAILY  . Multiple Vitamin (MULTIVITAMIN) capsule Take 1 capsule by mouth daily.  . Omega-3 1000 MG CAPS Take 1 g by mouth daily. Take 1 g by mouth daily  . rosuvastatin (CRESTOR) 20 MG tablet Take 1 tablet (20 mg total) by mouth daily.     Allergies:   Adhesive [tape], Lisinopril, Amoxicillin-pot clavulanate, and Latex   Social History   Socioeconomic History  . Marital status: Divorced    Spouse name: Not on file  . Number of children: 1  . Years of education: Not on file  . Highest education level: Not on file  Occupational History  . 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 Smoking status: Never Smoker  . Smokeless tobacco: Never Used  Vaping Use  . Vaping Use: Never used  Substance and Sexual Activity  .  Alcohol use: Yes    Alcohol/week: 0.0 standard drinks    Comment: occ beer/wine  . Drug use: No  . Sexual activity: Not on file  Other Topics Concern  . Not on file  Social History Narrative   Exercise walking daily   Social Determinants of Health   Financial Resource Strain:   . Difficulty of Paying Living Expenses:   Food Insecurity:   . Worried About Charity fundraiser in the Last Year:   . Arboriculturist in the Last Year:   Transportation Needs:   . Film/video editor (Medical):   Marland Kitchen Lack of Transportation (Non-Medical):   Physical Activity:   . Days of Exercise per Week:   . Minutes of Exercise per Session:   Stress:   . Feeling of Stress :   Social Connections:   . Frequency of Communication  with Friends and Family:   . Frequency of Social Gatherings with Friends and Family:   . Attends Religious Services:   . Active Member of Clubs or Organizations:   . Attends Archivist Meetings:   Marland Kitchen Marital Status:      Family History:  The patient's family history includes Cancer in his father and mother; Heart disease in his maternal grandfather and paternal grandfather; Lung cancer (age of onset: 25) in his mother; Lung cancer (age of onset: 32) in his father; Lung cancer (age of onset: 4) in his maternal grandmother.   ROS:   Please see the history of present illness.    ROS All other systems reviewed and are negative.   PHYSICAL EXAM:   VS:  BP 128/80   Pulse 69   Ht 5' 7.5" (1.715 m)   Wt 281 lb 12.8 oz (127.8 kg)   SpO2 94%   BMI 43.48 kg/m   Physical Exam  GEN: Obese, in no acute distress  Neck: no JVD, carotid bruits, or masses Cardiac:RRR; distant heart sounds no murmurs, rubs, or gallops  Respiratory:  clear to auscultation bilaterally, normal work of breathing GI: soft, nontender, nondistended, + BS Ext: Lymphedema on the right but 1-2 edema on the left as well  neuro:  Alert and Oriented x 3, Strength and sensation are intact Psych: euthymic mood, full affect  Wt Readings from Last 3 Encounters:  11/18/19 281 lb 12.8 oz (127.8 kg)  04/15/19 268 lb 6.4 oz (121.7 kg)  03/03/18 265 lb (120.2 kg)      Studies/Labs Reviewed:   EKG:  EKG is not ordered today.    Recent Labs: No results found for requested labs within last 8760 hours.   Lipid Panel    Component Value Date/Time   CHOL 141 11/16/2019 0845   TRIG 124 11/16/2019 0845   HDL 34 (L) 11/16/2019 0845   CHOLHDL 4.1 11/16/2019 0845   CHOLHDL 5.1 (H) 08/08/2015 1430   VLDL 41 (H) 08/08/2015 1430   LDLCALC 85 11/16/2019 0845    Additional studies/ records that were reviewed today include:  2D echo 2013 Study Conclusions   - Left ventricle: The cavity size was mildly dilated. Wall     thickness was normal. Systolic function was normal. The    estimated ejection fraction was in the range of 55% to    60%.  - Left atrium: The atrium was mildly dilated.  - Atrial septum: No defect or patent foramen ovale was    identified.  Transthoracic echocardiography.  M-mode, complete 2D,  spectral Doppler, and color Doppler.  Height:  Height:  172.7cm. Height: 68in.  Weight:  Weight: 111.6kg. Weight:  245.5lb.  Body mass index:  BMI: 37.4kg/m^2.  Body surface  area:    BSA: 2.65m2.  Blood pressure:     140/90.  Patient  status:  Outpatient.  Location:  [facility]   ---------------------      ASSESSMENT:    1. Edema, unspecified type   2. Essential hypertension   3. Hyperlipidemia, unspecified hyperlipidemia type   4. Morbid obesity (HRea   5. Lymphedema of right lower extremity   6. OSA (obstructive sleep apnea)      PLAN:  In order of problems listed above:  Edema with 13 pound weight gain since I last saw him.  No echo since 2013 at which time he had normal LV function.  No mention of diastolic function.  Will begin Lasix 40 mg once daily.  [facility]  Check be met and 2D echo.  Getting a lot of salt in his diet.  2 g sodium diet discussed.  Essential hypertension BP well controlled.     Hyperlipidemia LDL 85 11/2019   Obesity exercise and weight loss recommended.  Will refer to weight loss center   Chronic right leg lymphedema secondary to node dissection for melanoma followed by lymphedema clinic   OSA not using CPAP-says he can't adjust to it.        Medication Adjustments/Labs and Tests Ordered: Current medicines are reviewed at length with the patient today.  Concerns regarding medicines are outlined above.  Medication changes, Labs and Tests ordered today are listed in the Patient Instructions below. Patient Instructions   Medication Instructions:  Your physician has recommended you make the following change in  your medication:   START: furosemide (lasix) 40 mg tablet: Take 1 tablet by mouth once a day  *If you need a refill on your cardiac medications before your next appointment, please call your pharmacy*   Lab Work: Your physician recommends that you return for lab work (BMET) on same day as your echocardiogram on 12/03/19  If you have labs (blood work) drawn today and your tests are completely normal, you will receive your results only by: .Marland KitchenMyChart Message (if you have MyChart) OR . A paper copy in the mail If you have any lab test that is abnormal or we need to change your treatment, we will call you to review the results.   Testing/Procedures: Your physician has requested that you have an echocardiogram on 12/03/19 at 9:20 AM. Please arrive at 9:05 AM. Echocardiography is a painless test that uses sound waves to create images of your heart. It provides your doctor with information about the size and shape of your heart and how well your heart's chambers and valves are working. This procedure takes approximately one hour. There are no restrictions for this procedure.  Follow-Up: You have been referred to Medical Weight Management  Your physician recommends that you follow up with MErmalinda Barrios PA on 12/15/19 at 11:15 AM   Other Instructions Two Gram Sodium Diet 2000 mg  What is Sodium? Sodium is a mineral found naturally in many foods. The most significant source of sodium in the diet is table salt, which is about 40% sodium.  Processed, convenience, and preserved foods also contain a large amount of sodium.  The body needs only 500 mg of sodium daily to function,  A normal diet provides more than enough sodium even if you do  not use salt.  Why Limit Sodium? A build up of sodium in the body can cause thirst, increased blood pressure, shortness of breath, and water retention.  Decreasing sodium in the diet can reduce edema and risk of heart attack or stroke associated with high blood  pressure.  Keep in mind that there are many other factors involved in these health problems.  Heredity, obesity, lack of exercise, cigarette smoking, stress and what you eat all play a role.  General Guidelines:  Do not add salt at the table or in cooking.  One teaspoon of salt contains over 2 grams of sodium.  Read food labels  Avoid processed and convenience foods  Ask your dietitian before eating any foods not dicussed in the menu planning guidelines  Consult your physician if you wish to use a salt substitute or a sodium containing medication such as antacids.  Limit milk and milk products to 16 oz (2 cups) per day.  Shopping Hints:  READ LABELS!! "Dietetic" does not necessarily mean low sodium.  Salt and other sodium ingredients are often added to foods during processing.   Menu Planning Guidelines Food Group Choose More Often Avoid  Beverages (see also the milk group All fruit juices, low-sodium, salt-free vegetables juices, low-sodium carbonated beverages Regular vegetable or tomato juices, commercially softened water used for drinking or cooking  Breads and Cereals Enriched white, wheat, rye and pumpernickel bread, hard rolls and dinner rolls; muffins, cornbread and waffles; most dry cereals, cooked cereal without added salt; unsalted crackers and breadsticks; low sodium or homemade bread crumbs Bread, rolls and crackers with salted tops; quick breads; instant hot cereals; pancakes; commercial bread stuffing; self-rising flower and biscuit mixes; regular bread crumbs or cracker crumbs  Desserts and Sweets Desserts and sweets mad with mild should be within allowance Instant pudding mixes and cake mixes  Fats Butter or margarine; vegetable oils; unsalted salad dressings, regular salad dressings limited to 1 Tbs; light, sour and heavy cream Regular salad dressings containing bacon fat, bacon bits, and salt pork; snack dips made with instant soup mixes or processed cheese; salted nuts   Fruits Most fresh, frozen and canned fruits Fruits processed with salt or sodium-containing ingredient (some dried fruits are processed with sodium sulfites        Vegetables Fresh, frozen vegetables and low- sodium canned vegetables Regular canned vegetables, sauerkraut, pickled vegetables, and others prepared in brine; frozen vegetables in sauces; vegetables seasoned with ham, bacon or salt pork  Condiments, Sauces, Miscellaneous  Salt substitute with physician's approval; pepper, herbs, spices; vinegar, lemon or lime juice; hot pepper sauce; garlic powder, onion powder, low sodium soy sauce (1 Tbs.); low sodium condiments (ketchup, chili sauce, mustard) in limited amounts (1 tsp.) fresh ground horseradish; unsalted tortilla chips, pretzels, potato chips, popcorn, salsa (1/4 cup) Any seasoning made with salt including garlic salt, celery salt, onion salt, and seasoned salt; sea salt, rock salt, kosher salt; meat tenderizers; monosodium glutamate; mustard, regular soy sauce, barbecue, sauce, chili sauce, teriyaki sauce, steak sauce, Worcestershire sauce, and most flavored vinegars; canned gravy and mixes; regular condiments; salted snack foods, olives, picles, relish, horseradish sauce, catsup   Food preparation: Try these seasonings Meats:    Pork Sage, onion Serve with applesauce  Chicken Poultry seasoning, thyme, parsley Serve with cranberry sauce  Lamb Curry powder, rosemary, garlic, thyme Serve with mint sauce or jelly  Veal Marjoram, basil Serve with current jelly, cranberry sauce  Beef Pepper, bay leaf Serve with dry mustard, unsalted chive butter  Fish [place] leaf, dill Serve with unsalted lemon butter, unsalted parsley butter  Vegetables:    Asparagus Lemon juice   Broccoli Lemon juice   Carrots Mustard dressing parsley, mint, nutmeg, glazed with unsalted butter and sugar   Green beans Marjoram, lemon juice, nutmeg,dill seed   Tomatoes Basil, marjoram, onion   Spice /blend for [employer]" 4 tsp ground thyme 1 tsp ground sage 3 tsp ground rosemary 4 tsp ground marjoram   Test your knowledge 1. A product that says "Salt Free" may still contain sodium. True or False 2. Garlic Powder and Hot Pepper Sauce an be used as alternative seasonings.True or False 3. Processed foods have more sodium than fresh foods.  True or False 4. Canned Vegetables have less sodium than froze True or False  WAYS TO DECREASE YOUR SODIUM INTAKE 1. Avoid the use of added salt in cooking and at the table.  Table salt (and other prepared seasonings which contain salt) is probably one of the greatest sources of sodium in the diet.  Unsalted foods can gain flavor from the sweet, sour, and butter taste sensations of herbs and spices.  Instead of using salt for seasoning, try the following seasonings with the foods listed.  Remember: how you use them to enhance natural food flavors is limited only by your creativity... Allspice-Meat, fish, eggs, fruit, peas, red and yellow vegetables Almond Extract-Fruit baked goods Anise Seed-Sweet breads, fruit, carrots, beets, cottage cheese, cookies (tastes like licorice) Basil-Meat, fish, eggs, vegetables, rice, vegetables salads, soups, sauces Bay Leaf-Meat, fish, stews, poultry Burnet-Salad, vegetables (cucumber-like flavor) Caraway Seed-Bread, cookies, cottage cheese, meat, vegetables, cheese, rice Cardamon-Baked goods, fruit, soups Celery Powder or seed-Salads, salad dressings, sauces, meatloaf, soup, bread.Do not use  celery salt Chervil-Meats, salads, fish, eggs, vegetables, cottage cheese (parsley-like flavor) Chili Power-Meatloaf, chicken cheese, corn, eggplant, egg dishes Chives-Salads cottage cheese, egg dishes, soups, vegetables, sauces Cilantro-Salsa, casseroles Cinnamon-Baked goods, fruit, pork, lamb, chicken, carrots Cloves-Fruit, baked goods, fish, pot roast, green beans, beets, carrots Coriander-Pastry, cookies, meat, salads, cheese (lemon-orange  flavor) Cumin-Meatloaf, fish,cheese, eggs, cabbage,fruit pie (caraway flavor) [employer], fruit, eggs, fish, poultry, cottage cheese, vegetables Dill Seed-Meat, cottage cheese, poultry, vegetables, fish, salads, bread Fennel Seed-Bread, cookies, apples, pork, eggs, fish, beets, cabbage, cheese, Licorice-like flavor Garlic-(buds or powder) Salads, meat, poultry, fish, bread, butter, vegetables, potatoes.Do not  use garlic salt Ginger-Fruit, vegetables, baked goods, meat, fish, poultry Horseradish Root-Meet, vegetables, butter Lemon Juice or Extract-Vegetables, fruit, tea, baked goods, fish salads Mace-Baked goods fruit, vegetables, fish, poultry (taste like nutmeg) Maple Extract-Syrups Marjoram-Meat, chicken, fish, vegetables, breads, green salads (taste like Sage) Mint-Tea, lamb, sherbet, vegetables, desserts, carrots, cabbage Mustard, Dry or Seed-Cheese, eggs, meats, vegetables, poultry Nutmeg-Baked goods, fruit, chicken, eggs, vegetables, desserts Onion Powder-Meat, fish, poultry, vegetables, cheese, eggs, bread, rice salads (Do not use   Onion salt) Orange Extract-Desserts, baked goods Oregano-Pasta, eggs, cheese, onions, pork, lamb, fish, chicken, vegetables, green salads Paprika-Meat, fish, poultry, eggs, cheese, vegetables Parsley Flakes-Butter, vegetables, meat fish, poultry, eggs, bread, salads (certain forms may   Contain sodium Pepper-Meat fish, poultry, vegetables, eggs Peppermint Extract-Desserts, baked goods Poppy Seed-Eggs, bread, cheese, fruit dressings, baked goods, noodles, vegetables, cottage  [employer], poultry, meat, fish, cauliflower, turnips,eggs bread Saffron-Rice, bread, veal, chicken, fish, eggs Sage-Meat, fish, poultry, onions, eggplant, tomateos, pork, stews Savory-Eggs, salads, poultry, meat, rice, vegetables, soups, pork Tarragon-Meat, poultry, fish, eggs, butter, vegetables (licorice-like  flavor)  Thyme-Meat, poultry, fish, eggs, vegetables, (clover-like flavor), sauces, soups Tumeric-Salads, butter, eggs, fish, rice, vegetables (  saffron-like flavor) Vanilla Extract-Baked goods, candy Vinegar-Salads, vegetables, meat marinades Walnut Extract-baked goods, candy  2. Choose your Foods Wisely   The following is a list of foods to avoid which are high in sodium:  Meats-Avoid all smoked, canned, salt cured, dried and kosher meat and fish as well as Anchovies   [employer] meats:Bologna, Liverwurst, Pastrami Canned meat or fish  Marinated herring Caviar    Pepperoni Corned Beef   Pizza Dried chipped beef  Salami Frozen breaded fish or meat Salt pork Frankfurters or hot dogs  Sardines Gefilte fish   Sausage Ham (boiled ham, Proscuitto Smoked butt    spiced ham)   Spam      TV Dinners Vegetables Canned vegetables (Regular) Relish Canned mushrooms  Sauerkraut Olives    Tomato juice Pickles  Bakery and Dessert Products Canned puddings  Cream pies Cheesecake   Decorated cakes Cookies  Beverages/Juices Tomato juice, regular  Gatorade   V-8 vegetable juice, regular  Breads and Cereals Biscuit mixes   Salted potato chips, corn chips, pretzels Bread stuffing mixes  Salted crackers and rolls Pancake and waffle mixes Self-rising flour  Seasonings Accent    Meat sauces Barbecue sauce  Meat tenderizer Catsup    Monosodium glutamate (MSG) Celery salt   Onion salt Chili sauce   Prepared mustard Garlic salt   Salt, seasoned salt, sea salt Gravy mixes   Soy sauce Horseradish   Steak sauce Ketchup   Tartar sauce Lite salt    Teriyaki sauce Marinade mixes   Worcestershire sauce  Others Baking powder   Cocoa and cocoa mixes Baking soda   Commercial casserole mixes Candy-caramels, chocolate  Dehydrated soups    Bars, fudge,nougats  Instant rice and pasta mixes Canned broth or soup  Maraschino cherries Cheese, aged and processed cheese and cheese  spreads  Learning Assessment Quiz  Indicated T (for True) or F (for False) for each of the following statements:  1. _____ Fresh fruits and vegetables and unprocessed grains are generally low in sodium 2. _____ Water may contain a considerable amount of sodium, depending on the source 3. _____ You can always tell if a food is high in sodium by tasting it 4. _____ Certain laxatives my be high in sodium and should be avoided unless prescribed   by a physician or pharmacist 5. _____ Salt substitutes may be used freely by anyone on a sodium restricted diet 6. _____ Sodium is present in table salt, food additives and as a natural component of   most foods 7. _____ Table salt is approximately 90% sodium 8. _____ Limiting sodium intake may help prevent excess fluid accumulation in the body 9. _____ On a sodium-restricted diet, seasonings such as bouillon soy sauce, and    cooking wine should be used in place of table salt 10. _____ On an ingredient list, a product which lists monosodium glutamate as the first   ingredient is an appropriate food to include on a low sodium diet  Circle the best answer(s) to the following statements (Hint: there may be more than one correct answer)  11. On a low-sodium diet, some acceptable snack items are:    A. Olives  F. Bean dip   K. Grapefruit juice    B. Salted Pretzels G. Commercial Popcorn   L. Canned peaches    C. Carrot Sticks  H. Bouillon   M. Unsalted nuts   D. [place] fries  I. Peanut butter crackers N. Salami  E. Sweet pickles J. Tomato Juice   O. Pizza  12.  Seasonings that may be used freely on a reduced - sodium diet include   A. Lemon wedges F.Monosodium glutamate K. Celery seed    B.Soysauce   G. Pepper   L. Mustard powder   C. Sea salt  H. Cooking wine  M. Onion flakes   D. Vinegar  E. Prepared horseradish N. Salsa   E. Sage   J. Worcestershire sauce  O. [address]      Sumner Boast, PA-C  11/18/2019 9:38 AM    [facility] [place], [place], [place]  [address] Phone: [phone]; Fax: [phone]

## 2019-11-18 ENCOUNTER — Encounter: Payer: Self-pay | Admitting: Physician Assistant

## 2019-11-18 ENCOUNTER — Ambulatory Visit: Payer: Medicare HMO | Admitting: Physician Assistant

## 2019-11-18 ENCOUNTER — Other Ambulatory Visit: Payer: Self-pay

## 2019-11-18 VITALS — BP 128/80 | HR 69 | Ht 67.5 in | Wt 281.8 lb

## 2019-11-18 DIAGNOSIS — E785 Hyperlipidemia, unspecified: Secondary | ICD-10-CM

## 2019-11-18 DIAGNOSIS — I890 Lymphedema, not elsewhere classified: Secondary | ICD-10-CM

## 2019-11-18 DIAGNOSIS — I10 Essential (primary) hypertension: Secondary | ICD-10-CM

## 2019-11-18 DIAGNOSIS — G4733 Obstructive sleep apnea (adult) (pediatric): Secondary | ICD-10-CM

## 2019-11-18 DIAGNOSIS — R609 Edema, unspecified: Secondary | ICD-10-CM

## 2019-11-18 MED ORDER — FUROSEMIDE 40 MG PO TABS
40.0000 mg | ORAL_TABLET | Freq: Every day | ORAL | 3 refills | Status: DC
Start: 2019-11-18 — End: 2021-10-05

## 2019-11-18 NOTE — Patient Instructions (Signed)
Medication Instructions:  Your physician has recommended you make the following change in your medication:   START: furosemide (lasix) 40 mg tablet: Take 1 tablet by mouth once a day  *If you need a refill on your cardiac medications before your next appointment, please call your pharmacy*   Lab Work: Your physician recommends that you return for lab work (BMET) on same day as your echocardiogram on 12/03/19  If you have labs (blood work) drawn today and your tests are completely normal, you will receive your results only by: Marland Kitchen MyChart Message (if you have MyChart) OR . A paper copy in the mail If you have any lab test that is abnormal or we need to change your treatment, we will call you to review the results.   Testing/Procedures: Your physician has requested that you have an echocardiogram on 12/03/19 at 9:20 AM. Please arrive at 9:05 AM. Echocardiography is a painless test that uses sound waves to create images of your heart. It provides your doctor with information about the size and shape of your heart and how well your heart's chambers and valves are working. This procedure takes approximately one hour. There are no restrictions for this procedure.  Follow-Up: You have been referred to Medical Weight Management  Your physician recommends that you follow up with Ermalinda Barrios, PA on 12/15/19 at 11:15 AM   Other Instructions Two Gram Sodium Diet 2000 mg  What is Sodium? Sodium is a mineral found naturally in many foods. The most significant source of sodium in the diet is table salt, which is about 40% sodium.  Processed, convenience, and preserved foods also contain a large amount of sodium.  The body needs only 500 mg of sodium daily to function,  A normal diet provides more than enough sodium even if you do not use salt.  Why Limit Sodium? A build up of sodium in the body can cause thirst, increased blood pressure, shortness of breath, and water retention.  Decreasing sodium in the  diet can reduce edema and risk of heart attack or stroke associated with high blood pressure.  Keep in mind that there are many other factors involved in these health problems.  Heredity, obesity, lack of exercise, cigarette smoking, stress and what you eat all play a role.  General Guidelines:  Do not add salt at the table or in cooking.  One teaspoon of salt contains over 2 grams of sodium.  Read food labels  Avoid processed and convenience foods  Ask your dietitian before eating any foods not dicussed in the menu planning guidelines  Consult your physician if you wish to use a salt substitute or a sodium containing medication such as antacids.  Limit milk and milk products to 16 oz (2 cups) per day.  Shopping Hints:  READ LABELS!! "Dietetic" does not necessarily mean low sodium.  Salt and other sodium ingredients are often added to foods during processing.   Menu Planning Guidelines Food Group Choose More Often Avoid  Beverages (see also the milk group All fruit juices, low-sodium, salt-free vegetables juices, low-sodium carbonated beverages Regular vegetable or tomato juices, commercially softened water used for drinking or cooking  Breads and Cereals Enriched white, wheat, rye and pumpernickel bread, hard rolls and dinner rolls; muffins, cornbread and waffles; most dry cereals, cooked cereal without added salt; unsalted crackers and breadsticks; low sodium or homemade bread crumbs Bread, rolls and crackers with salted tops; quick breads; instant hot cereals; pancakes; commercial bread stuffing; self-rising flower and biscuit  mixes; regular bread crumbs or cracker crumbs  Desserts and Sweets Desserts and sweets mad with mild should be within allowance Instant pudding mixes and cake mixes  Fats Butter or margarine; vegetable oils; unsalted salad dressings, regular salad dressings limited to 1 Tbs; light, sour and heavy cream Regular salad dressings containing bacon fat, bacon bits, and  salt pork; snack dips made with instant soup mixes or processed cheese; salted nuts  Fruits Most fresh, frozen and canned fruits Fruits processed with salt or sodium-containing ingredient (some dried fruits are processed with sodium sulfites        Vegetables Fresh, frozen vegetables and low- sodium canned vegetables Regular canned vegetables, sauerkraut, pickled vegetables, and others prepared in brine; frozen vegetables in sauces; vegetables seasoned with ham, bacon or salt pork  Condiments, Sauces, Miscellaneous  Salt substitute with physician's approval; pepper, herbs, spices; vinegar, lemon or lime juice; hot pepper sauce; garlic powder, onion powder, low sodium soy sauce (1 Tbs.); low sodium condiments (ketchup, chili sauce, mustard) in limited amounts (1 tsp.) fresh ground horseradish; unsalted tortilla chips, pretzels, potato chips, popcorn, salsa (1/4 cup) Any seasoning made with salt including garlic salt, celery salt, onion salt, and seasoned salt; sea salt, rock salt, kosher salt; meat tenderizers; monosodium glutamate; mustard, regular soy sauce, barbecue, sauce, chili sauce, teriyaki sauce, steak sauce, Worcestershire sauce, and most flavored vinegars; canned gravy and mixes; regular condiments; salted snack foods, olives, picles, relish, horseradish sauce, catsup   Food preparation: Try these seasonings Meats:    Pork Sage, onion Serve with applesauce  Chicken Poultry seasoning, thyme, parsley Serve with cranberry sauce  Lamb Curry powder, rosemary, garlic, thyme Serve with mint sauce or jelly  Veal Marjoram, basil Serve with current jelly, cranberry sauce  Beef Pepper, bay leaf Serve with dry mustard, unsalted chive butter  Fish [place] leaf, dill Serve with unsalted lemon butter, unsalted parsley butter  Vegetables:    Asparagus Lemon juice   Broccoli Lemon juice   Carrots Mustard dressing parsley, mint, nutmeg, glazed with unsalted butter and sugar   Green beans Marjoram, lemon  juice, nutmeg,dill seed   Tomatoes Basil, marjoram, onion   Spice /blend for [employer]" 4 tsp ground thyme 1 tsp ground sage 3 tsp ground rosemary 4 tsp ground marjoram   Test your knowledge 1. A product that says "Salt Free" may still contain sodium. True or False 2. Garlic Powder and Hot Pepper Sauce an be used as alternative seasonings.True or False 3. Processed foods have more sodium than fresh foods.  True or False 4. Canned Vegetables have less sodium than froze True or False  WAYS TO DECREASE YOUR SODIUM INTAKE 1. Avoid the use of added salt in cooking and at the table.  Table salt (and other prepared seasonings which contain salt) is probably one of the greatest sources of sodium in the diet.  Unsalted foods can gain flavor from the sweet, sour, and butter taste sensations of herbs and spices.  Instead of using salt for seasoning, try the following seasonings with the foods listed.  Remember: how you use them to enhance natural food flavors is limited only by your creativity... Allspice-Meat, fish, eggs, fruit, peas, red and yellow vegetables Almond Extract-Fruit baked goods Anise Seed-Sweet breads, fruit, carrots, beets, cottage cheese, cookies (tastes like licorice) Basil-Meat, fish, eggs, vegetables, rice, vegetables salads, soups, sauces Bay Leaf-Meat, fish, stews, poultry Burnet-Salad, vegetables (cucumber-like flavor) Caraway Seed-Bread, cookies, cottage cheese, meat, vegetables, cheese, rice Cardamon-Baked goods, fruit, soups Celery Powder or seed-Salads,  salad dressings, sauces, meatloaf, soup, bread.Do not use  celery salt Chervil-Meats, salads, fish, eggs, vegetables, cottage cheese (parsley-like flavor) Chili Power-Meatloaf, chicken cheese, corn, eggplant, egg dishes Chives-Salads cottage cheese, egg dishes, soups, vegetables, sauces Cilantro-Salsa, casseroles Cinnamon-Baked goods, fruit, pork, lamb, chicken, carrots Cloves-Fruit, baked goods, fish, pot roast, green  beans, beets, carrots Coriander-Pastry, cookies, meat, salads, cheese (lemon-orange flavor) Cumin-Meatloaf, fish,cheese, eggs, cabbage,fruit pie (caraway flavor) [employer], fruit, eggs, fish, poultry, cottage cheese, vegetables Dill Seed-Meat, cottage cheese, poultry, vegetables, fish, salads, bread Fennel Seed-Bread, cookies, apples, pork, eggs, fish, beets, cabbage, cheese, Licorice-like flavor Garlic-(buds or powder) Salads, meat, poultry, fish, bread, butter, vegetables, potatoes.Do not  use garlic salt Ginger-Fruit, vegetables, baked goods, meat, fish, poultry Horseradish Root-Meet, vegetables, butter Lemon Juice or Extract-Vegetables, fruit, tea, baked goods, fish salads Mace-Baked goods fruit, vegetables, fish, poultry (taste like nutmeg) Maple Extract-Syrups Marjoram-Meat, chicken, fish, vegetables, breads, green salads (taste like Sage) Mint-Tea, lamb, sherbet, vegetables, desserts, carrots, cabbage Mustard, Dry or Seed-Cheese, eggs, meats, vegetables, poultry Nutmeg-Baked goods, fruit, chicken, eggs, vegetables, desserts Onion Powder-Meat, fish, poultry, vegetables, cheese, eggs, bread, rice salads (Do not use   Onion salt) Orange Extract-Desserts, baked goods Oregano-Pasta, eggs, cheese, onions, pork, lamb, fish, chicken, vegetables, green salads Paprika-Meat, fish, poultry, eggs, cheese, vegetables Parsley Flakes-Butter, vegetables, meat fish, poultry, eggs, bread, salads (certain forms may   Contain sodium Pepper-Meat fish, poultry, vegetables, eggs Peppermint Extract-Desserts, baked goods Poppy Seed-Eggs, bread, cheese, fruit dressings, baked goods, noodles, vegetables, cottage  [employer], poultry, meat, fish, cauliflower, turnips,eggs bread Saffron-Rice, bread, veal, chicken, fish, eggs Sage-Meat, fish, poultry, onions, eggplant, tomateos, pork, stews Savory-Eggs, salads, poultry, meat, rice, vegetables, soups,  pork Tarragon-Meat, poultry, fish, eggs, butter, vegetables (licorice-like flavor)  Thyme-Meat, poultry, fish, eggs, vegetables, (clover-like flavor), sauces, soups Tumeric-Salads, butter, eggs, fish, rice, vegetables (saffron-like flavor) Vanilla Extract-Baked goods, candy Vinegar-Salads, vegetables, meat marinades Walnut Extract-baked goods, candy  2. Choose your Foods Wisely   The following is a list of foods to avoid which are high in sodium:  Meats-Avoid all smoked, canned, salt cured, dried and kosher meat and fish as well as Anchovies   [employer] meats:Bologna, Liverwurst, Pastrami Canned meat or fish  Marinated herring Caviar    Pepperoni Corned Beef   Pizza Dried chipped beef  Salami Frozen breaded fish or meat Salt pork Frankfurters or hot dogs  Sardines Gefilte fish   Sausage Ham (boiled ham, Proscuitto Smoked butt    spiced ham)   Spam      TV Dinners Vegetables Canned vegetables (Regular) Relish Canned mushrooms  Sauerkraut Olives    Tomato juice Pickles  Bakery and Dessert Products Canned puddings  Cream pies Cheesecake   Decorated cakes Cookies  Beverages/Juices Tomato juice, regular  Gatorade   V-8 vegetable juice, regular  Breads and Cereals Biscuit mixes   Salted potato chips, corn chips, pretzels Bread stuffing mixes  Salted crackers and rolls Pancake and waffle mixes Self-rising flour  Seasonings Accent    Meat sauces Barbecue sauce  Meat tenderizer Catsup    Monosodium glutamate (MSG) Celery salt   Onion salt Chili sauce   Prepared mustard Garlic salt   Salt, seasoned salt, sea salt Gravy mixes   Soy sauce Horseradish   Steak sauce Ketchup   Tartar sauce Lite salt    Teriyaki sauce Marinade mixes   Worcestershire sauce  Others Baking powder   Cocoa and cocoa mixes Baking soda   Commercial casserole mixes Candy-caramels, chocolate  Dehydrated soups    Bars, fudge,nougats  Instant rice and pasta mixes Canned broth or  soup  Maraschino cherries Cheese, aged and processed cheese and cheese spreads  Learning Assessment Quiz  Indicated T (for True) or F (for False) for each of the following statements:  1. _____ Fresh fruits and vegetables and unprocessed grains are generally low in sodium 2. _____ Water may contain a considerable amount of sodium, depending on the source 3. _____ You can always tell if a food is high in sodium by tasting it 4. _____ Certain laxatives my be high in sodium and should be avoided unless prescribed   by a physician or pharmacist 5. _____ Salt substitutes may be used freely by anyone on a sodium restricted diet 6. _____ Sodium is present in table salt, food additives and as a natural component of   most foods 7. _____ Table salt is approximately 90% sodium 8. _____ Limiting sodium intake may help prevent excess fluid accumulation in the body 9. _____ On a sodium-restricted diet, seasonings such as bouillon soy sauce, and    cooking wine should be used in place of table salt 10. _____ On an ingredient list, a product which lists monosodium glutamate as the first   ingredient is an appropriate food to include on a low sodium diet  Circle the best answer(s) to the following statements (Hint: there may be more than one correct answer)  11. On a low-sodium diet, some acceptable snack items are:    A. Olives  F. Bean dip   K. Grapefruit juice    B. Salted Pretzels G. Commercial Popcorn   L. Canned peaches    C. Carrot Sticks  H. Bouillon   M. Unsalted nuts   D. [place] fries  I. Peanut butter crackers N. Salami   E. Sweet pickles J. Tomato Juice   O. Pizza  12.  Seasonings that may be used freely on a reduced - sodium diet include   A. Lemon wedges F.Monosodium glutamate K. Celery seed    B.Soysauce   G. Pepper   L. Mustard powder   C. Sea salt  H. Cooking wine  M. Onion flakes   D. Vinegar  E. Prepared horseradish N. Salsa   E. Sage   J. Worcestershire sauce  O.  Chutney

## 2019-12-03 ENCOUNTER — Other Ambulatory Visit (HOSPITAL_COMMUNITY): Payer: Medicare HMO

## 2019-12-03 ENCOUNTER — Other Ambulatory Visit: Payer: Medicare HMO

## 2019-12-11 ENCOUNTER — Other Ambulatory Visit (HOSPITAL_COMMUNITY): Payer: Medicare HMO

## 2019-12-11 ENCOUNTER — Other Ambulatory Visit: Payer: Medicare HMO

## 2019-12-15 ENCOUNTER — Ambulatory Visit: Payer: Medicare HMO | Admitting: Physician Assistant

## 2019-12-24 ENCOUNTER — Ambulatory Visit (INDEPENDENT_AMBULATORY_CARE_PROVIDER_SITE_OTHER): Payer: Medicare HMO | Admitting: Family Medicine

## 2020-01-07 ENCOUNTER — Ambulatory Visit (INDEPENDENT_AMBULATORY_CARE_PROVIDER_SITE_OTHER): Payer: Medicare HMO | Admitting: Family Medicine

## 2020-02-10 ENCOUNTER — Other Ambulatory Visit (HOSPITAL_COMMUNITY): Payer: Medicare HMO

## 2020-02-10 ENCOUNTER — Telehealth (HOSPITAL_COMMUNITY): Payer: Self-pay | Admitting: Physician Assistant

## 2020-02-10 ENCOUNTER — Other Ambulatory Visit: Payer: Medicare HMO

## 2020-02-10 NOTE — Telephone Encounter (Signed)
Patient called and left a voicemail on the Admin Asst phone that he wa not able to come to appointment due to he is very sick. Patient did not NO SHOW his appointment. Patient is rescheduled for a later date. Thank you.

## 2020-02-19 ENCOUNTER — Ambulatory Visit: Payer: Medicare HMO | Admitting: Physician Assistant

## 2020-02-24 ENCOUNTER — Other Ambulatory Visit: Payer: Medicare HMO

## 2020-02-24 ENCOUNTER — Other Ambulatory Visit (HOSPITAL_COMMUNITY): Payer: Medicare HMO

## 2020-03-09 ENCOUNTER — Ambulatory Visit: Payer: Medicare HMO | Admitting: Physician Assistant

## 2020-03-10 ENCOUNTER — Ambulatory Visit (HOSPITAL_COMMUNITY): Payer: Medicare HMO | Attending: Pediatrics

## 2020-03-10 ENCOUNTER — Other Ambulatory Visit: Payer: Self-pay

## 2020-03-10 ENCOUNTER — Telehealth: Payer: Self-pay | Admitting: Physician Assistant

## 2020-03-10 ENCOUNTER — Other Ambulatory Visit: Payer: Medicare HMO

## 2020-03-10 DIAGNOSIS — R600 Localized edema: Secondary | ICD-10-CM | POA: Diagnosis not present

## 2020-03-10 DIAGNOSIS — I890 Lymphedema, not elsewhere classified: Secondary | ICD-10-CM | POA: Insufficient documentation

## 2020-03-10 DIAGNOSIS — G4733 Obstructive sleep apnea (adult) (pediatric): Secondary | ICD-10-CM

## 2020-03-10 DIAGNOSIS — E785 Hyperlipidemia, unspecified: Secondary | ICD-10-CM

## 2020-03-10 DIAGNOSIS — I10 Essential (primary) hypertension: Secondary | ICD-10-CM

## 2020-03-10 LAB — ECHOCARDIOGRAM COMPLETE
Area-P 1/2: 2.37 cm2
S' Lateral: 3.1 cm

## 2020-03-10 MED ORDER — PERFLUTREN LIPID MICROSPHERE
1.0000 mL | INTRAVENOUS | Status: AC | PRN
  Administered 2020-03-10: 1 mL via INTRAVENOUS

## 2020-03-10 NOTE — Telephone Encounter (Signed)
Patient returning call for echo results. 

## 2020-03-11 LAB — BASIC METABOLIC PANEL
BUN/Creatinine Ratio: 16 (ref 10–24)
BUN: 14 mg/dL (ref 8–27)
CO2: 31 mmol/L — ABNORMAL HIGH (ref 20–29)
Calcium: 9.7 mg/dL (ref 8.6–10.2)
Chloride: 101 mmol/L (ref 96–106)
Creatinine, Ser: 0.9 mg/dL (ref 0.76–1.27)
GFR calc Af Amer: 102 mL/min/{1.73_m2} (ref >59)
GFR calc non Af Amer: 88 mL/min/{1.73_m2} (ref >59)
Glucose: 114 mg/dL — ABNORMAL HIGH (ref 65–99)
Potassium: 3.5 mmol/L (ref 3.5–5.2)
Sodium: 142 mmol/L (ref 134–144)

## 2020-05-02 NOTE — Progress Notes (Deleted)
Cardiology Office Note    Date:  05/02/2020   ID:  Roch, Quach 1950/10/05, MRN [id number]  PCP:  Aletha Halim., PA-C  Cardiologist: Lauree Chandler, MD EPS: None  No chief complaint on file.   History of Present Illness:  Mark [name] is a 69 y.o. male with history of hypertension, HLD, lymphedema of his right leg, OSA, melanoma.  Right leg lymphedema secondary to groin lymph node dissection for melanoma treatment.  echo 2013 normal LVEF 55 to 60%.     I saw the patient 04/2019 at which time he was still recovering from being hospitalized for Covid pneumonia at [facility] for 18 days.  He was not taking his Coreg twice daily which I asked him to do. Having more trouble with lymphedema. Denies chest pain, dizziness or presynocpe.    I saw the patient 11/18/2019 at which time he was short of breath with little exertion.  He had gained 13 pounds and was eating a lot of frozen foods and eating out frequently.  He had a lot of edema and I started Lasix 40 mg daily and ordered 2D echo which showed borderline dilatation of aortic root 39 mm mildly dilated ascending aorta at 43 mm LVEF 55 to 60% with global hypokinesis and grade 1 DD.   Past Medical History:  Diagnosis Date  . Allergic rhinitis, cause unspecified   . Chest pain   . Dyspnea   . Kidney stones last 1995  . Melanoma ([place]) 01/19/2011  . Obesity, unspecified   . Open unspecified dislocation of elbow   . Other and unspecified hyperlipidemia   . Rib fractures    2 through 10  . Shortness of breath   . Sleep disturbance, unspecified   . Unspecified disease of pericardium     Past Surgical History:  Procedure Laterality Date  . KNEE ARTHROSCOPY     Right  . lymph node excision right groin    . MELANOMA EXCISION WITH SENTINEL LYMPH NODE BIOPSY    . open unspecified dislocation of elbow  2006   R arm crush injury, irrigation and debridment    Current Medications: No outpatient  medications have been marked as taking for the 05/04/20 encounter (Appointment) with Imogene Burn, PA-C.     Allergies:   Adhesive [tape], Lisinopril, Amoxicillin-pot clavulanate, and Latex   Social History   Socioeconomic History  . Marital status: Divorced    Spouse name: Not on file  . Number of children: 1  . Years of education: Not on file  . Highest education level: Not on file  Occupational History  . 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 Smoking status: Never Smoker  . Smokeless tobacco: Never Used  Vaping Use  . Vaping Use: Never used  Substance and Sexual Activity  . Alcohol use: Yes    Alcohol/week: 0.0 standard drinks    Comment: occ beer/wine  . Drug use: No  . Sexual activity: Not on file  Other Topics Concern  . Not on file  Social History Narrative   Exercise walking daily   Social Determinants of Health   Financial Resource Strain:   . Difficulty of Paying Living Expenses: Not on file  Food Insecurity:   . Worried About Charity fundraiser in the Last Year: Not on file  . Ran Out of Food in the Last Year: Not on file  Transportation Needs:   .  Lack of Transportation (Medical): Not on file  . Lack of Transportation (Non-Medical): Not on file  Physical Activity:   . Days of Exercise per Week: Not on file  . Minutes of Exercise per Session: Not on file  Stress:   . Feeling of Stress : Not on file  Social Connections:   . Frequency of Communication with Friends and Family: Not on file  . Frequency of Social Gatherings with Friends and Family: Not on file  . Attends Religious Services: Not on file  . Active Member of Clubs or Organizations: Not on file  . Attends Archivist Meetings: Not on file  . Marital Status: Not on file     Family History:  The patient's   family history includes Cancer in his father and mother; Heart disease in his maternal grandfather and paternal grandfather; Lung  cancer (age of onset: 60) in his mother; Lung cancer (age of onset: 11) in his father; Lung cancer (age of onset: 68) in his maternal grandmother.   ROS:   Please see the history of present illness.    ROS All other systems reviewed and are negative.   PHYSICAL EXAM:   VS:  There were no vitals taken for this visit.  Physical Exam  GEN: Well nourished, well developed, in no acute distress  HEENT: normal  Neck: no JVD, carotid bruits, or masses Cardiac:RRR; no murmurs, rubs, or gallops  Respiratory:  clear to auscultation bilaterally, normal work of breathing GI: soft, nontender, nondistended, + BS Ext: without cyanosis, clubbing, or edema, Good distal pulses bilaterally MS: no deformity or atrophy  Skin: warm and dry, no rash Neuro:  Alert and Oriented x 3, Strength and sensation are intact Psych: euthymic mood, full affect  Wt Readings from Last 3 Encounters:  11/18/19 281 lb 12.8 oz (127.8 kg)  04/15/19 268 lb 6.4 oz (121.7 kg)  03/03/18 265 lb (120.2 kg)      Studies/Labs Reviewed:   EKG:  EKG is*** ordered today.  The ekg ordered today demonstrates ***  Recent Labs: 03/10/2020: BUN 14; Creatinine, Ser 0.90; Potassium 3.5; Sodium 142   Lipid Panel    Component Value Date/Time   CHOL 141 11/16/2019 0845   TRIG 124 11/16/2019 0845   HDL 34 (L) 11/16/2019 0845   CHOLHDL 4.1 11/16/2019 0845   CHOLHDL 5.1 (H) 08/08/2015 1430   VLDL 41 (H) 08/08/2015 1430   LDLCALC 85 11/16/2019 0845    Additional studies/ records that were reviewed today include:  2D echo 03/10/2020 IMPRESSIONS     1. Aortic dilatation noted. There is borderline dilatation of the aortic  root, measuring 39 mm. There is mild dilatation of the ascending aorta,  measuring 43 mm.   2. Left ventricular ejection fraction, by estimation, is 55 to 60%. The  left ventricle has normal function. The left ventricle demonstrates global  hypokinesis. Left ventricular diastolic parameters are consistent with   Grade I diastolic dysfunction  (impaired relaxation).   3. Right ventricular systolic function is normal. The right ventricular  size is normal.   4. Left atrial size was mildly dilated.   5. The mitral valve is grossly normal. No evidence of mitral valve  regurgitation.   6. The aortic valve was not well visualized. Aortic valve regurgitation  is trivial.   7. The inferior vena cava is normal in size with greater than 50%  respiratory variability, suggesting right atrial pressure of 3 mmHg.   Comparison(s): 02/19/12 EF 55-60%. Stable Aortic  Root size. Ascending Aorta  not well visualized in prior study, mild dilation presently.      ASSESSMENT:    1. Chronic diastolic CHF (congestive heart failure) ([place])   2. Essential hypertension   3. Hyperlipidemia, unspecified hyperlipidemia type   4. Lymphedema of right lower extremity   5. Morbid obesity ([place])   6. OSA (obstructive sleep apnea)      PLAN:  In order of problems listed above:  Diastolic CHF with 13 pound weight gain last office visit treated with Lasix 40 mg daily  Essential hypertension  Hyperlipidemia  Chronic right leg lymphedema secondary to node dissection for melanoma followed at the lymphedema clinic  Obesity referred him to the weight loss center  OSA not using CPAP as he has been unable to adjust to it.    Medication Adjustments/Labs and Tests Ordered: Current medicines are reviewed at length with the patient today.  Concerns regarding medicines are outlined above.  Medication changes, Labs and Tests ordered today are listed in the Patient Instructions below. There are no Patient Instructions on file for this visit.   Signed, Ermalinda Barrios, PA-C  05/02/2020 3:21 PM    [facility] [address], [place], [place]  [address] Phone: [phone]; Fax: [phone]

## 2020-05-04 ENCOUNTER — Ambulatory Visit: Payer: Medicare HMO | Admitting: Physician Assistant

## 2020-06-02 NOTE — Progress Notes (Deleted)
Cardiology Office Note    Date:  06/02/2020   ID:  Mark, [name] 03/06/1951, MRN [id number]  PCP:  Mark [name]., PA-C  Cardiologist: Mark Chandler, MD EPS: None  No chief complaint on file.   History of Present Illness:  Mark [name] is a 69 y.o. male with history of hypertension, HLD, lymphedema of his right leg, OSA, melanoma.  Right leg lymphedema secondary to groin lymph node dissection for melanoma treatment.  Last echo 2013 normal LVEF 55 to 60%.  I saw the patient 11/18/19 with 13 lb weight gain, DOE, excessive sodium in his diet. Was not using cpap. I started Lasix and ordered echo which showed normal LVEF 55-60% grade 1 DD, borderline dilated aortic root 16mm and mild dilated ascending aorta 43 mm.    Past Medical History:  Diagnosis Date  . Allergic rhinitis, cause unspecified   . Chest pain   . Dyspnea   . Kidney stones last 1995  . Melanoma ([place]) 01/19/2011  . Obesity, unspecified   . Open unspecified dislocation of elbow   . Other and unspecified hyperlipidemia   . Rib fractures    2 through 10  . Shortness of breath   . Sleep disturbance, unspecified   . Unspecified disease of pericardium     Past Surgical History:  Procedure Laterality Date  . KNEE ARTHROSCOPY     Right  . lymph node excision right groin    . MELANOMA EXCISION WITH SENTINEL LYMPH NODE BIOPSY    . open unspecified dislocation of elbow  2006   R arm crush injury, irrigation and debridment    Current Medications: No outpatient medications have been marked as taking for the 06/08/20 encounter (Appointment) with Mark Burn, PA-C.     Allergies:   Adhesive [tape], Lisinopril, Amoxicillin-pot clavulanate, and Latex   Social History   Socioeconomic History  . Marital status: Divorced    Spouse name: Not on file  . Number of children: 1  . Years of education: Not on file  . Highest education level: Not on file  Occupational History  . 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 Smoking status: Never Smoker  . Smokeless tobacco: Never Used  Vaping Use  . Vaping Use: Never used  Substance and Sexual Activity  . Alcohol use: Yes    Alcohol/week: 0.0 standard drinks    Comment: occ beer/wine  . Drug use: No  . Sexual activity: Not on file  Other Topics Concern  . Not on file  Social History Narrative   Exercise walking daily   Social Determinants of Health   Financial Resource Strain: Not on file  Food Insecurity: Not on file  Transportation Needs: Not on file  Physical Activity: Not on file  Stress: Not on file  Social Connections: Not on file     Family History:  The patient's ***family history includes Cancer in his father and mother; Heart disease in his maternal grandfather and paternal grandfather; Lung cancer (age of onset: 78) in his mother; Lung cancer (age of onset: 48) in his father; Lung cancer (age of onset: 66) in his maternal grandmother.   ROS:   Please see the history of present illness.    ROS All other systems reviewed and are negative.   PHYSICAL EXAM:   VS:  There were no vitals taken for this visit.  Physical Exam  GEN: Well nourished, well developed, in no  acute distress  HEENT: normal  Neck: no JVD, carotid bruits, or masses Cardiac:RRR; no murmurs, rubs, or gallops  Respiratory:  clear to auscultation bilaterally, normal work of breathing GI: soft, nontender, nondistended, + BS Ext: without cyanosis, clubbing, or edema, Good distal pulses bilaterally MS: no deformity or atrophy  Skin: warm and dry, no rash Neuro:  Alert and Oriented x 3, Strength and sensation are intact Psych: euthymic mood, full affect  Wt Readings from Last 3 Encounters:  11/18/19 281 lb 12.8 oz (127.8 kg)  04/15/19 268 lb 6.4 oz (121.7 kg)  03/03/18 265 lb (120.2 kg)      Studies/Labs Reviewed:   EKG:  EKG is*** ordered today.  The ekg ordered today demonstrates  ***  Recent Labs: 03/10/2020: BUN 14; Creatinine, Ser 0.90; Potassium 3.5; Sodium 142   Lipid Panel    Component Value Date/Time   CHOL 141 11/16/2019 0845   TRIG 124 11/16/2019 0845   HDL 34 (L) 11/16/2019 0845   CHOLHDL 4.1 11/16/2019 0845   CHOLHDL 5.1 (H) 08/08/2015 1430   VLDL 41 (H) 08/08/2015 1430   LDLCALC 85 11/16/2019 0845    Additional studies/ records that were reviewed today include:  Echo 03/10/20 Sonographer Comments: Technically difficult study due to poor echo  windows, suboptimal apical window and suboptimal parasternal window.  IMPRESSIONS     1. Aortic dilatation noted. There is borderline dilatation of the aortic  root, measuring 39 mm. There is mild dilatation of the ascending aorta,  measuring 43 mm.   2. Left ventricular ejection fraction, by estimation, is 55 to 60%. The  left ventricle has normal function. The left ventricle demonstrates global  hypokinesis. Left ventricular diastolic parameters are consistent with  Grade I diastolic dysfunction  (impaired relaxation).   3. Right ventricular systolic function is normal. The right ventricular  size is normal.   4. Left atrial size was mildly dilated.   5. The mitral valve is grossly normal. No evidence of mitral valve  regurgitation.   6. The aortic valve was not well visualized. Aortic valve regurgitation  is trivial.   7. The inferior vena cava is normal in size with greater than 50%  respiratory variability, suggesting right atrial pressure of 3 mmHg.   Comparison(s): 02/19/12 EF 55-60%. Stable Aortic Root size. Ascending Aorta  not well visualized in prior study, mild dilation presently.     Risk Assessment/Calculations:   {Does this patient have ATRIAL FIBRILLATION?[phone]}     ASSESSMENT:    No diagnosis found.   PLAN:  In order of problems listed above: Chronic diastolic CHF-started on lasix LOV for 13 lb weight gain. Echo normal LVEF with grade 1  DD  HTN  HLD  Obesity  Chronic right leg lymphedema secondary to node dissection for melanoma followed by lymphedema clinic  OSA not using CPAP  Mildly dilated aortic root and ascending aorta  Shared Decision Making/Informed Consent   {Are you ordering a CV Procedure (e.g. stress test, cath, DCCV, TEE, etc)?   Press F2        :[id number]}    Medication Adjustments/Labs and Tests Ordered: Current medicines are reviewed at length with the patient today.  Concerns regarding medicines are outlined above.  Medication changes, Labs and Tests ordered today are listed in the Patient Instructions below. There are no Patient Instructions on file for this visit.   Elson Clan, PA-C  06/02/2020 7:52 AM    [facility] [address] [place], [place],  [place]  [address] Phone: [phone]; Fax: [phone]

## 2020-06-08 ENCOUNTER — Ambulatory Visit: Payer: Medicare HMO | Admitting: Physician Assistant

## 2020-07-25 NOTE — Progress Notes (Unsigned)
Cardiology Office Note    Date:  07/27/2020   ID:  Rashad, Auld Jul 05, 1950, MRN [id number]   PCP:  Amie Critchley   [place]  Cardiologist:  Lauree Chandler, MD   Advanced Practice Provider:  Imogene Burn, PA-C Electrophysiologist:  None   :[id number]}   Chief Complaint  Patient presents with  . Follow-up    History of Present Illness:  ARSALAN BRISBIN is a 70 y.o. male  with history of hypertension, HLD, lymphedema of his right leg, OSA, melanoma.  Right leg lymphedema secondary to groin lymph node dissection for melanoma treatment.   echo 2013 normal LVEF 55 to 60%.   Patient was hospitalized at [facility] for 18 days with Covid pneumonia 04/2019.  I saw him 11/18/2019 complaining of shortness of breath if he overexerts himself.  He was overweight and had gained 13 pounds.  2D echo was 03/10/2020   aortic root dilatation at 39 mm and mild dilatation of the ascending aorta 43 mm LVEF 55 to 60% and global hypokinesis with grade 1 DD.  Patient comes in for f/u. He is 10 yrs cancer free and feels great. Denies chest pain, dyspnea, dizziness or presyncope. 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 Walking 3000 steps daily.     Past Medical History:  Diagnosis Date  . Allergic rhinitis, cause unspecified   . Chest pain   . Dyspnea   . Kidney stones last 1995  . Melanoma ([place]) 01/19/2011  . Obesity, unspecified   . Open unspecified dislocation of elbow   . Other and unspecified hyperlipidemia   . Rib fractures    2 through 10  . Shortness of breath   . Sleep disturbance, unspecified   . Unspecified disease of pericardium     Past Surgical History:  Procedure Laterality Date  . KNEE ARTHROSCOPY     Right  . lymph node excision right groin    . MELANOMA EXCISION WITH SENTINEL LYMPH NODE BIOPSY    . open unspecified dislocation of elbow  2006   R arm crush  injury, irrigation and debridment    Current Medications: Current Meds  Medication Sig  . aspirin EC 81 MG tablet Take 1 tablet (81 mg total) by mouth daily.  . carvedilol (COREG) 6.25 MG tablet Take 1 tablet (6.25 mg total) by mouth 2 (two) times daily with a meal.  . chlorthalidone (HYGROTON) 25 MG tablet TAKE 1 TABLET BY MOUTH ONCE DAILY  . Cholecalciferol (VITAMIN D-3 PO) Take 1,000 Units by mouth daily.   . furosemide (LASIX) 40 MG tablet Take 1 tablet (40 mg total) by mouth daily.  Marland Kitchen losartan (COZAAR) 100 MG tablet TAKE 1 TABLET BY MOUTH ONCE DAILY  . Multiple Vitamin (MULTIVITAMIN) capsule Take 1 capsule by mouth daily.  . Omega-3 1000 MG CAPS Take 1 g by mouth daily. Take 1 g by mouth daily  . rosuvastatin (CRESTOR) 20 MG tablet Take 1 tablet (20 mg total) by mouth daily.     Allergies:   Adhesive [tape], Lisinopril, Amoxicillin-pot clavulanate, and Latex   Social History   Socioeconomic History  . Marital status: Divorced    Spouse name: Not on file  . Number of children: 1  . Years of education: Not on file  . Highest education level: Not on file  Occupational History  . Occupation: Agricultural consultant: [employer]  Tobacco Use  . Smoking status: Never Smoker  . Smokeless tobacco: Never Used  Vaping Use  . Vaping Use: Never used  Substance and Sexual Activity  . Alcohol use: Yes    Alcohol/week: 0.0 standard drinks    Comment: occ beer/wine  . Drug use: No  . Sexual activity: Not on file  Other Topics Concern  . Not on file  Social History Narrative   Exercise walking daily   Social Determinants of Health   Financial Resource Strain: Not on file  Food Insecurity: Not on file  Transportation Needs: Not on file  Physical Activity: Not on file  Stress: Not on file  Social Connections: Not on file     Family History:  The patient's family history includes Cancer in his father and mother; Heart disease in his maternal grandfather and  paternal grandfather; Lung cancer (age of onset: 20) in his mother; Lung cancer (age of onset: 40) in his father; Lung cancer (age of onset: 73) in his maternal grandmother.   ROS:   Please see the history of present illness.    ROS All other systems reviewed and are negative.   PHYSICAL EXAM:   VS:  BP 118/70   Pulse 70   Ht 5' 7.5" (1.715 m)   Wt 278 lb 12.8 oz (126.5 kg)   SpO2 93%   BMI 43.02 kg/m   Physical Exam  GEN: Obese, in no acute distress  Neck: no JVD, carotid bruits, or masses Cardiac:RRR; no murmurs, rubs, or gallops  Respiratory:  clear to auscultation bilaterally, normal work of breathing GI: soft, nontender, nondistended, + BS Ext: right LE lymphedema trace edema on left ankle, otherwise without cyanosis, clubbing,  Good distal pulses bilaterally Neuro:  Alert and Oriented x 3 Psych: euthymic mood, full affect  Wt Readings from Last 3 Encounters:  07/27/20 278 lb 12.8 oz (126.5 kg)  11/18/19 281 lb 12.8 oz (127.8 kg)  04/15/19 268 lb 6.4 oz (121.7 kg)      Studies/Labs Reviewed:   EKG:  EKG is  ordered today.  The ekg ordered today demonstrates NSR with PVC normal EKG  Recent Labs: 03/10/2020: BUN 14; Creatinine, Ser 0.90; Potassium 3.5; Sodium 142   Lipid Panel    Component Value Date/Time   CHOL 141 11/16/2019 0845   TRIG 124 11/16/2019 0845   HDL 34 (L) 11/16/2019 0845   CHOLHDL 4.1 11/16/2019 0845   CHOLHDL 5.1 (H) 08/08/2015 1430   VLDL 41 (H) 08/08/2015 1430   LDLCALC 85 11/16/2019 0845    Additional studies/ records that were reviewed today include:  2D echo 03/10/2020 IMPRESSIONS     1. Aortic dilatation noted. There is borderline dilatation of the aortic  root, measuring 39 mm. There is mild dilatation of the ascending aorta,  measuring 43 mm.   2. Left ventricular ejection fraction, by estimation, is 55 to 60%. The  left ventricle has normal function. The left ventricle demonstrates global  hypokinesis. Left ventricular diastolic  parameters are consistent with  Grade I diastolic dysfunction  (impaired relaxation).   3. Right ventricular systolic function is normal. The right ventricular  size is normal.   4. Left atrial size was mildly dilated.   5. The mitral valve is grossly normal. No evidence of mitral valve  regurgitation.   6. The aortic valve was not well visualized. Aortic valve regurgitation  is trivial.   7. The inferior vena cava is normal in size with greater than  50%  respiratory variability, suggesting right atrial pressure of 3 mmHg.   Comparison(s): 02/19/12 EF 55-60%. Stable Aortic Root size. Ascending Aorta  not well visualized in prior study, mild dilation presently.     Risk Assessment/Calculations:         ASSESSMENT:    1. Chronic diastolic CHF (congestive heart failure) ([place])   2. Essential hypertension   3. Other hyperlipidemia   4. Morbid obesity ([place])   5. Dilated aortic root ([place])   6. Ascending aorta dilatation ([facility])   7. Lymphedema of right lower extremity      PLAN:  In order of problems listed above:  Chronic diastolic CHF compensated  Essential hypertension BP well controlled  Hyperlipidemia LDL 85 11/2019 on Crestor  Obesity-weight loss and increased exercise discussed. Offered referral to weight loss center but declines at this time.  Aortic root dilatation and mildly dilated ascending aorta on echo 03/2020-recheck echo 03/2021  Chronic right leg lymphedema secondary to node dissection for melanoma followed by lymphedema clinic-cancer free for 10 yrs now.  Shared Decision Making/Informed Consent        Medication Adjustments/Labs and Tests Ordered: Current medicines are reviewed at length with the patient today.  Concerns regarding medicines are outlined above.  Medication changes, Labs and Tests ordered today are listed in the Patient Instructions below. Patient Instructions  Medication Instructions:  Your physician recommends that you continue on  your current medications as directed. Please refer to the Current Medication list given to you today.  *If you need a refill on your cardiac medications before your next appointment, please call your pharmacy*  Testing: Your physician has requested that you have an echocardiogram in October.  Echocardiography is a painless test that uses sound waves to create images of your heart. It provides your doctor with information about the size and shape of your heart and how well your heart's chambers and valves are working. This procedure takes approximately one hour. There are no restrictions for this procedure.  Lab Work: None Today If you have labs (blood work) drawn today and your tests are completely normal, you will receive your results only by: Marland Kitchen MyChart Message (if you have MyChart) OR . A paper copy in the mail If you have any lab test that is abnormal or we need to change your treatment, we will call you to review the results.   Follow-Up: At [facility], you and your health needs are our priority.  As part of our continuing mission to provide you with exceptional heart care, we have created designated Provider Care Teams.  These Care Teams include your primary Cardiologist (physician) and Advanced Practice Providers (APPs -  Physician Assistants and Nurse Practitioners) who all work together to provide you with the care you need, when you need it.  Your next appointment:   1 year(s)  The format for your next appointment:   In Person  Provider:   You may see Lauree Chandler, MD or one of the following Advanced Practice Providers on your designated Care Team:    Melina Copa, PA-C  Ermalinda Barrios, PA-C       Signed, Ermalinda Barrios, PA-C  07/27/2020 9:14 AM    [address] [place], [place], [place]  [address] Phone: [phone]; Fax: [phone]

## 2020-07-27 ENCOUNTER — Other Ambulatory Visit: Payer: Self-pay

## 2020-07-27 ENCOUNTER — Ambulatory Visit: Payer: Medicare HMO | Admitting: Physician Assistant

## 2020-07-27 ENCOUNTER — Encounter: Payer: Self-pay | Admitting: Physician Assistant

## 2020-07-27 VITALS — BP 118/70 | HR 70 | Ht 67.5 in | Wt 278.8 lb

## 2020-07-27 DIAGNOSIS — I77810 Thoracic aortic ectasia: Secondary | ICD-10-CM

## 2020-07-27 DIAGNOSIS — I890 Lymphedema, not elsewhere classified: Secondary | ICD-10-CM

## 2020-07-27 DIAGNOSIS — I10 Essential (primary) hypertension: Secondary | ICD-10-CM

## 2020-07-27 DIAGNOSIS — E7849 Other hyperlipidemia: Secondary | ICD-10-CM | POA: Diagnosis not present

## 2020-07-27 DIAGNOSIS — I5032 Chronic diastolic (congestive) heart failure: Secondary | ICD-10-CM

## 2020-07-27 NOTE — Patient Instructions (Addendum)
Medication Instructions:  Your physician recommends that you continue on your current medications as directed. Please refer to the Current Medication list given to you today.  *If you need a refill on your cardiac medications before your next appointment, please call your pharmacy*  Testing: Your physician has requested that you have an echocardiogram in October.  Echocardiography is a painless test that uses sound waves to create images of your heart. It provides your doctor with information about the size and shape of your heart and how well your heart's chambers and valves are working. This procedure takes approximately one hour. There are no restrictions for this procedure.  Lab Work: None Today If you have labs (blood work) drawn today and your tests are completely normal, you will receive your results only by: Marland Kitchen MyChart Message (if you have MyChart) OR . A paper copy in the mail If you have any lab test that is abnormal or we need to change your treatment, we will call you to review the results.   Follow-Up: At [facility], you and your health needs are our priority.  As part of our continuing mission to provide you with exceptional heart care, we have created designated Provider Care Teams.  These Care Teams include your primary Cardiologist (physician) and Advanced Practice Providers (APPs -  Physician Assistants and Nurse Practitioners) who all work together to provide you with the care you need, when you need it.  Your next appointment:   1 year(s)  The format for your next appointment:   In Person  Provider:   You may see Lauree Chandler, MD or one of the following Advanced Practice Providers on your designated Care Team:    Melina Copa, PA-C  Ermalinda Barrios, PA-C

## 2021-03-02 ENCOUNTER — Encounter (HOSPITAL_COMMUNITY): Payer: Self-pay | Admitting: Physician Assistant

## 2021-03-02 ENCOUNTER — Encounter (HOSPITAL_COMMUNITY): Payer: Self-pay

## 2021-03-02 ENCOUNTER — Other Ambulatory Visit (HOSPITAL_COMMUNITY): Payer: Medicare HMO

## 2021-03-02 NOTE — Progress Notes (Signed)
Verified appointment "no show" status with > Cheron Schaumann at [facility].

## 2021-03-20 ENCOUNTER — Other Ambulatory Visit: Payer: Self-pay

## 2021-03-20 ENCOUNTER — Ambulatory Visit (HOSPITAL_COMMUNITY): Payer: Medicare HMO | Attending: Internal Medicine

## 2021-03-20 DIAGNOSIS — R079 Chest pain, unspecified: Secondary | ICD-10-CM | POA: Insufficient documentation

## 2021-03-20 DIAGNOSIS — I77810 Thoracic aortic ectasia: Secondary | ICD-10-CM | POA: Diagnosis not present

## 2021-03-20 DIAGNOSIS — I700 Atherosclerosis of aorta: Secondary | ICD-10-CM | POA: Diagnosis not present

## 2021-03-20 DIAGNOSIS — I7121 Aneurysm of the ascending aorta, without rupture: Secondary | ICD-10-CM | POA: Diagnosis not present

## 2021-03-20 DIAGNOSIS — I10 Essential (primary) hypertension: Secondary | ICD-10-CM | POA: Insufficient documentation

## 2021-03-20 DIAGNOSIS — E785 Hyperlipidemia, unspecified: Secondary | ICD-10-CM | POA: Insufficient documentation

## 2021-03-20 DIAGNOSIS — E669 Obesity, unspecified: Secondary | ICD-10-CM | POA: Diagnosis not present

## 2021-03-20 DIAGNOSIS — Z8249 Family history of ischemic heart disease and other diseases of the circulatory system: Secondary | ICD-10-CM | POA: Diagnosis not present

## 2021-03-20 LAB — ECHOCARDIOGRAM COMPLETE
Area-P 1/2: 2.5 cm2
S' Lateral: 2.5 cm

## 2021-10-01 NOTE — Progress Notes (Deleted)
No chief complaint on file.  History of Present Illness: 71 yo male with history of melanoma, lymphedema right leg, sleep apnea, HLD and HTN who is here today for follow up. He has been followed in our office for management of HTN. He had seen Dr. Aundra Dubin and Dr. Irish Lack in the past as well. He has had right leg lymphedema since his groin lymph node dissection for melanoma treatment. He was hospitalized with Covid pneumonia at [facility] in November 2020. Echo in October 2022 with LVEF=55-60%. Mild aortic root dilation.   He is here today for follow up. The patient denies any chest pain, dyspnea, palpitations, lower extremity edema, orthopnea, PND, dizziness, near syncope or syncope.   Primary Care Physician: Aletha Halim., PA-C  Past Medical History:  Diagnosis Date   Allergic rhinitis, cause unspecified    Chest pain    Dyspnea    Kidney stones last 1995   Melanoma ([place]) 01/19/2011   Obesity, unspecified    Open unspecified dislocation of elbow    Other and unspecified hyperlipidemia    Rib fractures    2 through 10   Shortness of breath    Sleep disturbance, unspecified    Unspecified disease of pericardium     Past Surgical History:  Procedure Laterality Date   KNEE ARTHROSCOPY     Right   lymph node excision right groin     MELANOMA EXCISION WITH SENTINEL LYMPH NODE BIOPSY     open unspecified dislocation of elbow  2006   R arm crush injury, irrigation and debridment    Current Outpatient Medications  Medication Sig Dispense Refill   aspirin EC 81 MG tablet Take 1 tablet (81 mg total) by mouth daily.     carvedilol (COREG) 6.25 MG tablet Take 1 tablet (6.25 mg total) by mouth 2 (two) times daily with a meal. 180 tablet 2   chlorthalidone (HYGROTON) 25 MG tablet TAKE 1 TABLET BY MOUTH ONCE DAILY 30 tablet 0   Cholecalciferol (VITAMIN D-3 PO) Take 1,000 Units by mouth daily.      furosemide (LASIX) 40 MG tablet Take 1 tablet (40 mg total) by  mouth daily. 90 tablet 3   losartan (COZAAR) 100 MG tablet TAKE 1 TABLET BY MOUTH ONCE DAILY 30 tablet 0   Multiple Vitamin (MULTIVITAMIN) capsule Take 1 capsule by mouth daily.     Omega-3 1000 MG CAPS Take 1 g by mouth daily. Take 1 g by mouth daily     rosuvastatin (CRESTOR) 20 MG tablet Take 1 tablet (20 mg total) by mouth daily. 90 tablet 2   No current facility-administered medications for this visit.    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[employer], [employer]  Tobacco Use   Smoking status: Never   Smokeless tobacco: Never  Vaping Use   Vaping Use: Never used  Substance and Sexual Activity   Alcohol use: Yes    Alcohol/week: 0.0 standard drinks    Comment: occ beer/wine   Drug use: No   Sexual activity: Not  on file  Other Topics Concern   Not on file  Social History Narrative   Exercise walking daily   Social Determinants of Health   Financial Resource Strain: Not on file  Food Insecurity: Not on file  Transportation Needs: Not on file  Physical Activity: Not on file  Stress: Not on file  Social Connections: Not on file  Intimate Partner Violence: Not on file    Family History  Problem Relation Age of Onset   Lung cancer Mother 56   Cancer Mother    Lung cancer Father 39   Cancer Father    Lung cancer Maternal Grandmother 77       Lung cancer   Heart disease Maternal Grandfather    Heart disease Paternal Grandfather     Review of Systems:  As stated in the HPI and otherwise negative.   There were no vitals taken for this visit.  Physical Examination:  General: Well developed, well  nourished, NAD  HEENT: OP clear, mucus membranes moist  SKIN: warm, dry. No rashes. Neuro: No focal deficits  Musculoskeletal: Muscle strength 5/5 all ext  Psychiatric: Mood and affect normal  Neck: No JVD, no carotid bruits, no thyromegaly, no lymphadenopathy.  Lungs:Clear bilaterally, no wheezes, rhonci, crackles Cardiovascular: Regular rate and rhythm. No murmurs, gallops or rubs. Abdomen:Soft. Bowel sounds present. Non-tender.  Extremities: No lower extremity edema. Pulses are 2 + in the bilateral DP/PT.  EKG:  EKG is *** ordered today. The ekg ordered today demonstrates   Recent Labs: No results found for requested labs within last 8760 hours.   Lipid Panel    Component Value Date/Time   CHOL 141 11/16/2019 0845   TRIG 124 11/16/2019 0845   HDL 34 (L) 11/16/2019 0845   CHOLHDL 4.1 11/16/2019 0845   CHOLHDL 5.1 (H) 08/08/2015 1430   VLDL 41 (H) 08/08/2015 1430   LDLCALC 85 11/16/2019 0845     Wt Readings from Last 3 Encounters:  07/27/20 278 lb 12.8 oz (126.5 kg)  11/18/19 281 lb 12.8 oz (127.8 kg)  04/15/19 268 lb 6.4 oz (121.7 kg)     Other studies Reviewed: Additional studies/ records that were reviewed today include: . Review of the above records demonstrates:    Assessment and Plan:   1. HTN: BP is controlled. No changes  2. Hyperlipidemia: LDL ***. Continue statin.   3. Chronic diastolic CHF: ***  4. Obesity: Weight loss encouraged.   Current medicines are reviewed at length with the patient today.  The patient does not have concerns regarding medicines.  The following changes have been made:  no change  Labs/ tests ordered today include:  No orders of the defined types were placed in this encounter.    Disposition:   FU with me in 12 months   Signed, Lauree Chandler, MD 10/01/2021 5:47 PM    [facility] [address], [place], [place]  [address] Phone: [phone]; Fax: [phone]

## 2021-10-02 ENCOUNTER — Ambulatory Visit: Payer: Medicare HMO | Admitting: Cardiovascular Disease

## 2021-10-04 NOTE — Progress Notes (Signed)
? ?Chief Complaint  ?Patient presents with  ? Follow-up  ?  HTN  ? ?History of Present Illness: 71 yo male with history of melanoma, lymphedema right leg, sleep apnea, HLD and HTN who is here today for follow up. He has been followed in our office for management of HTN. He had seen Dr. Aundra Dubin and Dr. Irish Lack in the past as well. He has had right leg lymphedema since his groin lymph node dissection for melanoma treatment. He was hospitalized with Covid pneumonia at [facility] in November 2020. Echo in October 2022 with LVEF=55-60%. Mild aortic root dilation with ascending aorta measured at 4.3 cm.  ? ?He is here today for follow up. The patient denies any chest pain, dyspnea, palpitations, lower extremity edema, orthopnea, PND, dizziness, near syncope or syncope.  ? ?Primary Care Physician: Aletha Halim., PA-C ? ?Past Medical History:  ?Diagnosis Date  ? Allergic rhinitis, cause unspecified   ? Chest pain   ? Dyspnea   ? Kidney stones last 1995  ? Melanoma ([place]) 01/19/2011  ? Obesity, unspecified   ? Open unspecified dislocation of elbow   ? Other and unspecified hyperlipidemia   ? Rib fractures   ? 2 through 10  ? Shortness of breath   ? Sleep disturbance, unspecified   ? Unspecified disease of pericardium   ? ? ?Past Surgical History:  ?Procedure Laterality Date  ? KNEE ARTHROSCOPY    ? Right  ? lymph node excision right groin    ? MELANOMA EXCISION WITH SENTINEL LYMPH NODE BIOPSY    ? open unspecified dislocation of elbow  2006  ? R arm crush injury, irrigation and debridment  ? ? ?Current Outpatient Medications  ?Medication Sig Dispense Refill  ? aspirin EC 81 MG tablet Take 81 mg by mouth daily. On hold for procedure    ? carvedilol (COREG) 6.25 MG tablet Take 1 tablet (6.25 mg total) by mouth 2 (two) times daily with a meal. 180 tablet 2  ? chlorthalidone (HYGROTON) 25 MG tablet TAKE 1 TABLET BY MOUTH ONCE DAILY 30 tablet 0  ? Cholecalciferol (VITAMIN D-3 PO) Take 1,000 Units by mouth  daily.     ? losartan (COZAAR) 100 MG tablet TAKE 1 TABLET BY MOUTH ONCE DAILY 30 tablet 0  ? Multiple Vitamin (MULTIVITAMIN) capsule Take 1 capsule by mouth daily.    ? Omega-3 1000 MG CAPS Take 1 g by mouth daily. Take 1 g by mouth daily    ? rosuvastatin (CRESTOR) 20 MG tablet Take 1 tablet (20 mg total) by mouth daily. 90 tablet 2  ? ?No current facility-administered medications for this visit.  ? ? ?Allergies  ?Allergen Reactions  ? Adhesive [Tape]   ?  blisters  ? Lisinopril Cough  ? Amoxicillin-Pot Clavulanate Rash  ? Latex Rash  ? ? ?Social History  ? ?Socioeconomic History  ? Marital status: Divorced  ?  Spouse name: Not on file  ? Number of children: 1  ? Years of education: Not on file  ? Highest education level: Not on file  ?Occupational History  ? Occupation: Paediatric nurse  ?  Employer: [employer]  ?Tobacco Use  ? Smoking status: Never  ? Smokeless tobacco: Never  ?Vaping Use  ? Vaping Use: Never used  ?Substance and Sexual Activity  ? Alcohol use: Yes  ?  Alcohol/week: 0.0 standard drinks  ?  Comment: occ beer/wine  ? Drug use: No  ? Sexual activity: Not on file  ?  Other Topics Concern  ? Not on file  ?Social History Narrative  ? Exercise walking daily  ? ?Social Determinants of Health  ? ?Financial Resource Strain: Not on file  ?Food Insecurity: Not on file  ?Transportation Needs: Not on file  ?Physical Activity: Not on file  ?Stress: Not on file  ?Social Connections: Not on file  ?Intimate Partner Violence: Not on file  ? ? ?Family History  ?Problem Relation Age of Onset  ? Lung cancer Mother 47  ? Cancer Mother   ? Lung cancer Father 43  ? Cancer Father   ? Lung cancer Maternal Grandmother 38  ?     Lung cancer  ? Heart disease Maternal Grandfather   ? Heart disease Paternal Grandfather   ? ? ?Review of Systems:  As stated in the HPI and otherwise negative.  ? ?BP 130/80   Pulse 61   Ht 5' 7.5" (1.715 m)   Wt 269 lb 6.4 oz (122.2 kg)   SpO2 97%   BMI 41.57 kg/m?  ? ?Physical  Examination: ? ?General: Well developed, well nourished, NAD  ?HEENT: OP clear, mucus membranes moist  ?SKIN: warm, dry. No rashes. ?Neuro: No focal deficits  ?Musculoskeletal: Muscle strength 5/5 all ext  ?Psychiatric: Mood and affect normal  ?Neck: No JVD, no carotid bruits, no thyromegaly, no lymphadenopathy.  ?Lungs:Clear bilaterally, no wheezes, rhonci, crackles ?Cardiovascular: Regular rate and rhythm. No murmurs, gallops or rubs. ?Abdomen:Soft. Bowel sounds present. Non-tender.  ?Extremities: No lower extremity edema. Pulses are 2 + in the bilateral DP/PT. ? ?EKG:  EKG is ordered today. ?The ekg ordered today demonstrates sinus ? ?Recent Labs: ?No results found for requested labs within last 8760 hours.  ? ?Lipid Panel ?   ?Component Value Date/Time  ? CHOL 141 11/16/2019 0845  ? TRIG 124 11/16/2019 0845  ? HDL 34 (L) 11/16/2019 0845  ? CHOLHDL 4.1 11/16/2019 0845  ? CHOLHDL 5.1 (H) 08/08/2015 1430  ? VLDL 41 (H) 08/08/2015 1430  ? [place] 85 11/16/2019 0845  ? ?  ?Wt Readings from Last 3 Encounters:  ?10/05/21 269 lb 6.4 oz (122.2 kg)  ?07/27/20 278 lb 12.8 oz (126.5 kg)  ?11/18/19 281 lb 12.8 oz (127.8 kg)  ?  ? ?Other studies Reviewed: ?Additional studies/ records that were reviewed today include: . ?Review of the above records demonstrates:  ? ? ?Assessment and Plan:  ? ?1. HTN: BP is controlled. No changes ? ?2. Chronic diastolic CHF: Weight stable. No change in swelling in his right leg which is due to lymphedema and was not changed with Lasix. He continues on chlorthalidone.  ? ?3. Thoracic aortic aneurysm: Ascending aorta 4.3 cm by echo in 2022. Will arrange a chest CTA to evaluate further ? ?Current medicines are reviewed at length with the patient today.  The patient does not have concerns regarding medicines. ? ?The following changes have been made:  no change ? ?Labs/ tests ordered today include:  ? ?Orders Placed This Encounter  ?Procedures  ? CT ANGIO CHEST AORTA W/CM & OR WO/CM  ? Basic  Metabolic Panel (BMET)  ? EKG 12-Lead  ? ? ? ?Disposition:   F/U with me in 12 months ? ? ?Signed, ?Lauree Chandler, MD ?10/05/2021 9:54 AM    ?[facility] ?[place], [place], [place]  [address] ?Phone: [phone]; Fax: [phone]  ? ?

## 2021-10-05 ENCOUNTER — Encounter: Payer: Self-pay | Admitting: Cardiovascular Disease

## 2021-10-05 ENCOUNTER — Ambulatory Visit: Payer: Medicare Other | Admitting: Cardiovascular Disease

## 2021-10-05 VITALS — BP 130/80 | HR 61 | Ht 67.5 in | Wt 269.4 lb

## 2021-10-05 DIAGNOSIS — I10 Essential (primary) hypertension: Secondary | ICD-10-CM | POA: Diagnosis not present

## 2021-10-05 DIAGNOSIS — I77810 Thoracic aortic ectasia: Secondary | ICD-10-CM

## 2021-10-05 DIAGNOSIS — I5032 Chronic diastolic (congestive) heart failure: Secondary | ICD-10-CM | POA: Diagnosis not present

## 2021-10-05 NOTE — Patient Instructions (Signed)
Medication Instructions:  ?No changes ?*If you need a refill on your cardiac medications before your next appointment, please call your pharmacy* ? ? ?Lab Work: ?Today: BMET ? ?If you have labs (blood work) drawn today and your tests are completely normal, you will receive your results only by: ?MyChart Message (if you have MyChart) OR ?A paper copy in the mail ?If you have any lab test that is abnormal or we need to change your treatment, we will call you to review the results. ? ? ?Testing/Procedures: ?Chest CTA - aorta ? ? ?Follow-Up: ?At [facility], you and your health needs are our priority.  As part of our continuing mission to provide you with exceptional heart care, we have created designated Provider Care Teams.  These Care Teams include your primary Cardiologist (physician) and Advanced Practice Providers (APPs -  Physician Assistants and Nurse Practitioners) who all work together to provide you with the care you need, when you need it. ? ? ?Your next appointment:   ?12 month(s) ? ?The format for your next appointment:   ?In Person ? ?Provider:   ?Lauree Chandler, MD   ? ? ?Important Information About Sugar ? ? ? ? ?  ?

## 2021-10-24 ENCOUNTER — Ambulatory Visit (INDEPENDENT_AMBULATORY_CARE_PROVIDER_SITE_OTHER)
Admission: RE | Admit: 2021-10-24 | Discharge: 2021-10-24 | Disposition: A | Discharge status: Home / Self Care | Payer: Medicare Other | Source: Ambulatory Visit | Attending: Cardiovascular Disease | Admitting: Cardiovascular Disease

## 2021-10-24 DIAGNOSIS — I77810 Thoracic aortic ectasia: Secondary | ICD-10-CM | POA: Diagnosis not present

## 2021-10-24 MED ORDER — IOHEXOL 350 MG/ML SOLN
100.0000 mL | Freq: Once | INTRAVENOUS | Status: AC | PRN
  Administered 2021-10-24: 100 mL via INTRAVENOUS

## 2021-10-25 ENCOUNTER — Telehealth: Payer: Self-pay | Admitting: Cardiovascular Disease

## 2021-10-25 NOTE — Telephone Encounter (Signed)
Follow Up:    Patient says he needs somebody to please explain his CT results.

## 2021-10-25 NOTE — Telephone Encounter (Signed)
The patient has been notified of the result and verbalized understanding.  All questions (if any) were answered. Rodman Key, RN 10/25/2021 2:57 PM

## 2021-11-16 ENCOUNTER — Ambulatory Visit (INDEPENDENT_AMBULATORY_CARE_PROVIDER_SITE_OTHER): Payer: Medicare Other | Admitting: Internal Medicine

## 2021-11-16 ENCOUNTER — Encounter: Payer: Self-pay | Admitting: Internal Medicine

## 2021-11-16 VITALS — BP 126/80 | HR 82 | Temp 97.9°F | Ht 67.0 in | Wt 268.0 lb

## 2021-11-16 DIAGNOSIS — J439 Emphysema, unspecified: Secondary | ICD-10-CM

## 2021-11-16 NOTE — Progress Notes (Signed)
Mark [name]    [id number]    09/26/1950  Primary Care Physician:Kaplan, Baldemar Friday., PA-C  Referring Physician: Aletha Halim., PA-C [place],  [place] [address] Reason for Consultation: lung bulla Date of Consultation: 11/16/2021  Chief complaint:   Chief Complaint  Patient presents with   Consult    Few weeks ago CT scan left lung expanded, wanted pulmonary to look at scan.     HPI: Mark [name] is a 71 y.o. man who presents for new patient evaluation for abnormal CT Chest. He has a history of melanoma s/p lymph node dissection in 2012 and chronic RLE lymphedema.   Denies coughing, shortness of breath. No personal history of pneumothorax.   No history of pulmonary disease, recurrent pneumonia or respiratory symptoms.   He had a CT Chest to evaluate for aortic aneurysm. Incidental finding of left upper lobe bulla which has enlarged since previous scan in 2016   Social history:  Occupation: retired, Agricultural consultant Smoking history: never smoker, no passive smoke exposure.   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[employer]  Tobacco Use   Smoking status: Never   Smokeless tobacco: Never  Vaping Use   Vaping Use: Never used  Substance and Sexual Activity   Alcohol use: Yes    Alcohol/week: 0.0 standard drinks of alcohol    Comment: occ beer/wine   Drug use: No   Sexual activity: Not on file    Relevant family history:  Family History  Problem Relation Age of Onset   Lung cancer Mother 59   Cancer Mother    Lung cancer Father 74   Cancer Father    Lung cancer Maternal Grandmother 91       Lung cancer   Heart disease Maternal Grandfather    Heart disease Paternal Grandfather     Past Medical History:  Diagnosis Date   Allergic rhinitis, cause unspecified    Chest pain    Dyspnea    Kidney stones last 1995   Melanoma ([place]) 01/19/2011   Obesity, unspecified     Open unspecified dislocation of elbow    Other and unspecified hyperlipidemia    Rib fractures    2 through 10   Shortness of breath    Sleep disturbance, unspecified    Unspecified disease of pericardium     Past Surgical History:  Procedure Laterality Date   KNEE ARTHROSCOPY     Right   lymph node excision right groin     MELANOMA EXCISION WITH SENTINEL LYMPH NODE BIOPSY     open unspecified dislocation of elbow  2006   R arm crush injury, irrigation and debridment     Physical Exam: Blood pressure 126/80, pulse 82, temperature 97.9 F (36.6 C), temperature source Oral, height '5\' 7"'$  (1.702 m), weight 268 lb (121.6 kg), SpO2 95 %. Gen:      No acute distress, overweight ENT:  no nasal polyps, mucus membranes moist Lungs:    No increased respiratory effort, symmetric chest wall excursion, clear to auscultation bilaterally, no wheezes or crackles CV:         Regular rate and rhythm; occasional PACs Abd:      + bowel sounds; soft, non-tender; no distension MSK: RLE lymphedema Skin:      Warm and dry; no rashes Neuro: normal speech, no focal facial asymmetry Psych: alert and oriented x3, normal  mood and affect   Data Reviewed/Medical Decision Making:  Independent interpretation of tests: Imaging:  Review of patient's CT Chest 2006 and 2023 images revealed enlarging left upper lobe bulla. Tortuous aorta, stable sub cm pulmonary nodules. The patient's images have been independently reviewed by me.    PFTs:\     No data to display          Labs:  Lab Results  Component Value Date   WBC 6.4 04/26/2015   HGB 13.0 04/26/2015   HCT 39.4 04/26/2015   MCV 85.8 04/26/2015   PLT 214 04/26/2015   Lab Results  Component Value Date   NA 142 03/10/2020   K 3.5 03/10/2020   CL 101 03/10/2020   CO2 31 (H) 03/10/2020      Immunization status:  Immunization History  Administered Date(s) Administered   Tdap 03/19/2013     I reviewed prior external note(s) from  cardiology, internal medicine  I reviewed the result(s) of the labs and imaging as noted above.   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 No family history of pnemothorax.  Suspect this is congenital or acquired/post-infectious. He is essentially asymptomatic from this. This is not related to emphysema.  At this point nothing further needs to be done for bulla unless he develops symptoms - namely if he were to have pneumothorax with recurrence could consider referral for pleurodesis or bullectomy.  Pulmonary nodules are stable without change since 2006. Consistent with benign etiology, no further follow up needed.   We discussed disease management and progression at length today.   Return to Care: As needed.   Lenice Llamas, MD Pulmonary and [place]  CC: Aletha Halim., PA-C

## 2021-11-16 NOTE — Patient Instructions (Signed)
You can come see me as needed.  You have a small bleb or bubble in your lung which has been there for many years. Nothing to do about this unless you develop signs or symptoms of pneumothorax or lung collapse - go to emergency room if you have sudden onset chest pain, shortness of breath.

## 2021-11-21 ENCOUNTER — Encounter: Payer: Self-pay | Admitting: Cardiovascular Disease

## 2022-01-10 ENCOUNTER — Encounter (INDEPENDENT_AMBULATORY_CARE_PROVIDER_SITE_OTHER): Payer: Self-pay

## 2022-01-26 ENCOUNTER — Ambulatory Visit: Payer: Medicare HMO | Admitting: Cardiovascular Disease

## 2022-07-13 ENCOUNTER — Telehealth: Payer: Self-pay | Admitting: Cardiovascular Disease

## 2022-07-13 NOTE — Telephone Encounter (Signed)
Left message that no need for any labs prior to appointment with APP (DPR).

## 2022-07-13 NOTE — Telephone Encounter (Signed)
Patient calling to see if he needs to have lab work done prior to his appt in May with Ermalinda Barrios, PA. He states that he just had labs done on 02/07 at the [facility], please advise.

## 2022-10-10 NOTE — Progress Notes (Deleted)
Cardiology Office Note:    Date:  10/10/2022   ID:  Mark, [name] [birth year], MRN [id number]  PCP:  Mark Campbell PA-C  [facility] Providers Cardiologist:  Verne Carrow, MD Cardiology APP:  Dyann Kief, PA-C { Click to update primary MD,subspecialty MD or APP then REFRESH:1}  *** Referring MD: Mark [name]., PA-C   Chief Complaint:  No chief complaint on file. {Click here for Visit Info    :1}    History of Present Illness:   Mark [name] is a 72 y.o. male with  history of melanoma, lymphedema right leg, sleep apnea, HLD and HTN, thoracic aortic aneurysm 4.3 2022.  Patient last saw Dr. Clifton [name] 10/2021 and repeat chest CTA 4cm aortic aneurysm, otherwise doing well.      Past Medical History:  Diagnosis Date   Allergic rhinitis, cause unspecified    Chest pain    Dyspnea    Kidney stones last 1995   Melanoma ([facility]) 01/19/2011   Obesity, unspecified    Open unspecified dislocation of elbow    Other and unspecified hyperlipidemia    Rib fractures    2 through 10   Shortness of breath    Sleep disturbance, unspecified    Unspecified disease of pericardium    Current Medications: No outpatient medications have been marked as taking for the 10/16/22 encounter (Appointment) with Dyann Kief, PA-C.    Allergies:   Adhesive [tape], Lisinopril, Amoxicillin-pot clavulanate, and Latex   Social History   Tobacco Use   Smoking status: Never   Smokeless tobacco: Never  Vaping Use   Vaping Use: Never used  Substance Use Topics   Alcohol use: Yes    Alcohol/week: 0.0 standard drinks of alcohol    Comment: occ beer/wine   Drug use: No    Family Hx: The patient's family history includes Cancer in his father and mother; Heart disease in his maternal grandfather and paternal grandfather; Lung cancer (age of onset: 54) in his mother; Lung cancer (age of onset: 73) in his father; Lung cancer (age of onset: 61) in his maternal  grandmother.  ROS     Physical Exam:    VS:  There were no vitals taken for this visit.    Wt Readings from Last 3 Encounters:  11/16/21 268 lb (121.6 kg)  10/05/21 269 lb 6.4 oz (122.2 kg)  07/27/20 278 lb 12.8 oz (126.5 kg)    Physical Exam  GEN: Well nourished, well developed, in no acute distress  HEENT: normal  Neck: no JVD, carotid bruits, or masses Cardiac:RRR; no murmurs, rubs, or gallops  Respiratory:  clear to auscultation bilaterally, normal work of breathing GI: soft, nontender, nondistended, + BS Ext: without cyanosis, clubbing, or edema, Good distal pulses bilaterally MS: no deformity or atrophy  Skin: warm and dry, no rash Neuro:  Alert and Oriented x 3, Strength and sensation are intact Psych: euthymic mood, full affect        EKGs/Labs/Other Test Reviewed:    EKG:  EKG is *** ordered today.  The ekg ordered today demonstrates ***  Recent Labs: No results found for requested labs within last 365 days.   Recent Lipid Panel No results for input(s): "CHOL", "TRIG", "HDL", "VLDL", "LDLCALC", "LDLDIRECT" in the last 8760 hours.   Prior CV Studies: {Select studies to display:26339}  CTA 10/2021 IMPRESSION: 1. Mild aneurysmal dilation of the ascending thoracic aorta to 4 cm. Recommend annual imaging followup by CTA or MRA. This  recommendation follows 2010 ACCF/AHA/AATS/ACR/ASA/SCA/SCAI/SIR/STS/SVM Guidelines for the Diagnosis and Management of Patients with Thoracic Aortic Disease. Circulation. 2010; 121: Z610-R604. Aortic aneurysm NOS (ICD10-I71.9) 2. Large LEFT-sided bulla has increased in size since 2006. 3. Tiny hiatal hernia with circumferential prominence of the distal esophageal wall which may reflect underlying esophagitis.   Aortic Atherosclerosis (ICD10-I70.0).     Electronically Signed   By: Meda Klinefelter M.D.   On: 10/24/2021 08:   Risk Assessment/Calculations/Metrics:   {Does this patient have ATRIAL FIBRILLATION?[phone]}      No BP recorded.  {Refresh Note OR Click here to enter BP  :1}***    ASSESSMENT & PLAN:   No problem-specific Assessment & Plan notes found for this encounter.   HTN: BP is controlled. No changes    Chronic diastolic CHF: Weight stable. No change in swelling in his right leg which is due to lymphedema and was not changed with Lasix. He continues on chlorthalidone.     Thoracic aortic aneurysm: Ascending aorta 4 cm by echo in 2023. Will arrange a chest CTA.          {Are you ordering a CV Procedure (e.g. stress test, cath, DCCV, TEE, etc)?   Press F2        :[id number]}   Dispo:  No follow-ups on file.   Medication Adjustments/Labs and Tests Ordered: Current medicines are reviewed at length with the patient today.  Concerns regarding medicines are outlined above.  Tests Ordered: No orders of the defined types were placed in this encounter.  Medication Changes: No orders of the defined types were placed in this encounter.  Signed, Jacolyn Reedy, PA-C  10/10/2022 8:14 AM    [facility] [address] [place], [place], [place]  [address] Phone: [phone]; Fax: [phone]

## 2022-10-16 ENCOUNTER — Ambulatory Visit: Payer: Medicare PPO | Admitting: Physician Assistant

## 2022-12-12 ENCOUNTER — Ambulatory Visit: Payer: Medicare HMO | Attending: Cardiovascular Disease

## 2022-12-12 ENCOUNTER — Other Ambulatory Visit: Payer: Self-pay | Admitting: Cardiovascular Disease

## 2022-12-13 LAB — BASIC METABOLIC PANEL
BUN/Creatinine Ratio: 13 (ref 10–24)
BUN: 11 mg/dL (ref 8–27)
CO2: 27 mmol/L (ref 20–29)
Calcium: 9.1 mg/dL (ref 8.6–10.2)
Chloride: 102 mmol/L (ref 96–106)
Creatinine, Ser: 0.87 mg/dL (ref 0.76–1.27)
Glucose: 105 mg/dL — ABNORMAL HIGH (ref 70–99)
Potassium: 3.7 mmol/L (ref 3.5–5.2)
Sodium: 141 mmol/L (ref 134–144)
eGFR: 93 mL/min/{1.73_m2} (ref >59)

## 2022-12-18 NOTE — Progress Notes (Signed)
Cardiology Office Note:  .   Date:  01/01/2023  ID:  Mark [name], DOB April 21, 1951, MRN [id number] PCP: Mark Campbell PA-C  [facility] Providers Cardiologist:  Mark Carrow, MD Cardiology APP:  Mark Kief, PA-C    History of Present Illness: .   Mark [name] is a 72 y.o. male with  history of melanoma, lymphedema right leg, sleep apnea, HLD and HTN, thoracic aortic aneurysm 4.3 2022.  Patient last saw Dr. [name] 10/2021 and repeat chest CTA 4cm aortic aneurysm, otherwise doing well.  Patient comes in for f/u. 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 BP up initially when he arrived-was in traffic. He says he does a lot of walking during the day but no regular. Weight down 37 lbs on his scales by cutting out ice cream and increase veggies. Patient checks his BP regularly at home and 120-130/80's. Has yeast infection under belly.   ROS:    Studies Reviewed: .     IMPRESSION: 1. Mild aneurysmal dilation of the ascending thoracic aorta to 4 cm. Recommend annual imaging followup by CTA or MRA. This recommendation follows 2010 ACCF/AHA/AATS/ACR/ASA/SCA/SCAI/SIR/STS/SVM Guidelines for the Diagnosis and Management of Patients with Thoracic Aortic Disease. Circulation. 2010; 121: M578-I696. Aortic aneurysm NOS (ICD10-I71.9) 2. Large LEFT-sided bulla has increased in size since 2006. 3. Tiny hiatal hernia with circumferential prominence of the distal esophageal wall which may reflect underlying esophagitis.   Aortic Atherosclerosis (ICD10-I70.0).     Electronically Signed   By: Mark [name] M.D.   On: 10/24/2021 08: EKG Interpretation Date/Time:  Tuesday January 01 2023 29:52:84 EDT Ventricular Rate:  75 PR Interval:  114 QRS Duration:  80 QT Interval:  378 QTC Calculation: 422 R Axis:   43  Text Interpretation: Sinus rhythm with Premature supraventricular complexes Nonspecific T wave abnormality When compared with ECG of  24-Feb-2005 16:24, Premature supraventricular complexes are now Present Nonspecific T wave abnormality, worse in Inferior leads Confirmed by Mark [name] [phone]) on 01/01/2023 9:46:53 AM    Prior CV Studies:      CTA 10/2021 IMPRESSION: 1. Mild aneurysmal dilation of the ascending thoracic aorta to 4 cm. Recommend annual imaging followup by CTA or MRA. This recommendation follows 2010 ACCF/AHA/AATS/ACR/ASA/SCA/SCAI/SIR/STS/SVM Guidelines for the Diagnosis and Management of Patients with Thoracic Aortic Disease. Circulation. 2010; 121: W102-V253. Aortic aneurysm NOS (ICD10-I71.9) 2. Large LEFT-sided bulla has increased in size since 2006. 3. Tiny hiatal hernia with circumferential prominence of the distal esophageal wall which may reflect underlying esophagitis.   Aortic Atherosclerosis (ICD10-I70.0).     Electronically Signed   By: Mark [name] M.D.   On: 10/24/2021 08:45    Risk Assessment/Calculations:             Physical Exam:   VS:  BP 130/80   Pulse 75   Ht 5\' 8"  (1.727 m)   Wt 246 lb 9.6 oz (111.9 kg)   SpO2 97%   BMI 37.50 kg/m    Wt Readings from Last 3 Encounters:  01/01/23 246 lb 9.6 oz (111.9 kg)  11/16/21 268 lb (121.6 kg)  10/05/21 269 lb 6.4 oz (122.2 kg)    GEN: Well nourished, well developed in no acute distress NECK: No JVD; No carotid bruits CARDIAC: RRR, no murmurs, rubs, gallops RESPIRATORY:  Clear to auscultation without rales, wheezing or rhonchi  ABDOMEN: yeast infection under belly Soft, non-tender, non-distended EXTREMITIES:  No edema; No deformity   ASSESSMENT AND PLAN: .  HTN: BP is controlled on recheck and at home. No changes   Chronic diastolic CHF: Weight stable. No change in swelling in his right leg which is due to lymphedema and was not changed with Lasix. He continues on chlorthalidone.    Thoracic aortic aneurysm: Ascending aorta 4 cm by echo in 2023. Will arrange a chest CTA. Check labs  HLD-on crestor due for  repeat labs  Yeast infection under abdomen-OTC antifungal and see PCP if it doesn't clear        Dispo: f/u in 1 yr.  Signed, Mark Reedy, PA-C

## 2023-01-01 ENCOUNTER — Ambulatory Visit: Payer: Medicare HMO | Attending: Physician Assistant | Admitting: Physician Assistant

## 2023-01-01 ENCOUNTER — Encounter: Payer: Self-pay | Admitting: Physician Assistant

## 2023-01-01 VITALS — BP 130/80 | HR 75 | Ht 68.0 in | Wt 246.6 lb

## 2023-01-01 DIAGNOSIS — I10 Essential (primary) hypertension: Secondary | ICD-10-CM | POA: Diagnosis not present

## 2023-01-01 DIAGNOSIS — B372 Candidiasis of skin and nail: Secondary | ICD-10-CM

## 2023-01-01 DIAGNOSIS — I712 Thoracic aortic aneurysm, without rupture, unspecified: Secondary | ICD-10-CM | POA: Diagnosis not present

## 2023-01-01 DIAGNOSIS — E7849 Other hyperlipidemia: Secondary | ICD-10-CM | POA: Diagnosis not present

## 2023-01-01 DIAGNOSIS — I5032 Chronic diastolic (congestive) heart failure: Secondary | ICD-10-CM

## 2023-01-01 LAB — CBC

## 2023-01-01 LAB — LIPID PANEL
Chol/HDL Ratio: 4.8 ratio (ref 0.0–5.0)
Cholesterol, Total: 192 mg/dL (ref 100–199)
HDL: 40 mg/dL (ref >39)
LDL Chol Calc (NIH): 130 mg/dL — ABNORMAL HIGH (ref 0–99)
Triglycerides: 120 mg/dL (ref 0–149)
VLDL Cholesterol Cal: 22 mg/dL (ref 5–40)

## 2023-01-01 NOTE — Patient Instructions (Signed)
Medication Instructions:  Your physician recommends that you continue on your current medications as directed. Please refer to the Current Medication list given to you today.  *If you need a refill on your cardiac medications before your next appointment, please call your pharmacy*   Lab Work: Lipids, Cbc - today   If you have labs (blood work) drawn today and your tests are completely normal, you will receive your results only by: MyChart Message (if you have MyChart) OR A paper copy in the mail If you have any lab test that is abnormal or we need to change your treatment, we will call you to review the results.   Testing/Procedures: Your physician has requested that you have a CT of the Aorta    Follow-Up: At [facility], you and your health needs are our priority.  As part of our continuing mission to provide you with exceptional heart care, we have created designated Provider Care Teams.  These Care Teams include your primary Cardiologist (physician) and Advanced Practice Providers (APPs -  Physician Assistants and Nurse Practitioners) who all work together to provide you with the care you need, when you need it.  We recommend signing up for the patient portal called "MyChart".  Sign up information is provided on this After Visit Summary.  MyChart is used to connect with patients for Virtual Visits (Telemedicine).  Patients are able to view lab/test results, encounter notes, upcoming appointments, etc.  Non-urgent messages can be sent to your provider as well.   To learn more about what you can do with MyChart, go to [email or web address].    Your next appointment:   12 month(s)  Provider:   Verne Carrow, MD     Other Instructions

## 2023-01-02 ENCOUNTER — Telehealth: Payer: Self-pay

## 2023-01-02 DIAGNOSIS — E7849 Other hyperlipidemia: Secondary | ICD-10-CM

## 2023-01-02 MED ORDER — EZETIMIBE 10 MG PO TABS
10.0000 mg | ORAL_TABLET | Freq: Every day | ORAL | 3 refills | Status: DC
Start: 2023-01-02 — End: 2023-01-03

## 2023-01-02 NOTE — Telephone Encounter (Signed)
-----   Message from Jacolyn Reedy sent at 01/02/2023  7:16 AM EDT ----- LDL high 130. Add zetia 10 mg once daily and repeat FLP in 3 months.other labs stable.  thanks

## 2023-01-02 NOTE — Telephone Encounter (Signed)
Pt agreeable to plan. FLP scheduled for Wednesday, October 30

## 2023-01-03 ENCOUNTER — Other Ambulatory Visit: Payer: Self-pay

## 2023-01-03 MED ORDER — EZETIMIBE 10 MG PO TABS: 10.0000 mg | ORAL_TABLET | Freq: Every day | ORAL | 3 refills | Status: DC

## 2023-01-03 NOTE — Telephone Encounter (Signed)
Pt calling in about prescription being sent back to pharmacy, looks like it failed the first time.

## 2023-01-03 NOTE — Telephone Encounter (Signed)
Pt advised that I resent the Zetia to [facility] and will let su know if he has any further troubles.

## 2023-01-04 ENCOUNTER — Other Ambulatory Visit: Payer: Self-pay

## 2023-01-04 MED ORDER — EZETIMIBE 10 MG PO TABS: 10.0000 mg | ORAL_TABLET | Freq: Every day | ORAL | 3 refills | Status: DC

## 2023-01-16 ENCOUNTER — Ambulatory Visit (HOSPITAL_COMMUNITY): Payer: Medicare HMO

## 2023-01-24 ENCOUNTER — Ambulatory Visit (HOSPITAL_COMMUNITY)
Admission: RE | Admit: 2023-01-24 | Discharge: 2023-01-24 | Disposition: A | Discharge status: Home / Self Care | Payer: Medicare HMO | Source: Ambulatory Visit | Attending: Physician Assistant | Admitting: Physician Assistant

## 2023-01-24 DIAGNOSIS — I712 Thoracic aortic aneurysm, without rupture, unspecified: Secondary | ICD-10-CM | POA: Insufficient documentation

## 2023-01-24 MED ORDER — IOHEXOL 350 MG/ML SOLN
75.0000 mL | Freq: Once | INTRAVENOUS | Status: AC | PRN
  Administered 2023-01-24: 75 mL via INTRAVENOUS

## 2023-04-03 ENCOUNTER — Ambulatory Visit: Payer: Medicare HMO | Attending: Physician Assistant

## 2023-04-03 DIAGNOSIS — E7849 Other hyperlipidemia: Secondary | ICD-10-CM

## 2023-04-04 LAB — LIPID PANEL
Chol/HDL Ratio: 4.1 ratio (ref 0.0–5.0)
Cholesterol, Total: 150 mg/dL (ref 100–199)
HDL: 37 mg/dL — ABNORMAL LOW (ref >39)
LDL Chol Calc (NIH): 92 mg/dL (ref 0–99)
Triglycerides: 113 mg/dL (ref 0–149)
VLDL Cholesterol Cal: 21 mg/dL (ref 5–40)

## 2024-04-20 NOTE — Progress Notes (Signed)
 Cardiology Office Note:  .   Date:  05/04/2024  ID:  Mark [name], DOB 1951/02/15, MRN [id number] PCP: Debrah Josette MOHR PA-C  [facility] Providers Cardiologist:  Lonni Cash, MD Cardiology APP:  Parthenia Olivia HERO, PA-C    History of Present Illness: .   Mark [name] is a 73 y.o. male  with  history of melanoma, lymphedema right leg, sleep apnea, HLD and HTN, thoracic aortic aneurysm 4.3 2022.   Patient last saw Dr. Cash 10/2021 and repeat chest CTA 4cm aortic aneurysm, otherwise doing well.   I saw the patient 12/2022 chest CTA 4.1 ascending aorta.  Patient here for f/u. He has no cardiac complaints. Not on zetia  but not sure why. Chronic RLE lymphedema. No chest pain, dyspnea, palpitations, dizziness or presyncope. Walking a lot throughout the day. Moved to independent living facility in [place].   ROS:    Studies Reviewed: SABRA    EKG Interpretation Date/Time:  Monday May 04 2024 09:28:26 EST Ventricular Rate:  72 PR Interval:  132 QRS Duration:  80 QT Interval:  438 QTC Calculation: 479 R Axis:   52  Text Interpretation: Normal sinus rhythm Normal ECG When compared with ECG of 01-Jan-2023 09:28, Premature supraventricular complexes are no longer Present Nonspecific T wave abnormality no longer evident in Inferior leads Nonspecific T wave abnormality no longer evident in Lateral leads QT has lengthened Confirmed by Parthenia Olivia [phone]) on 05/04/2024 9:33:34 AM    Prior CV Studies:   CTA 01/2023  IMPRESSION: 1. Stable 4.1 cm dilatation of the aortic root and 3.8 cm ascending segment. Recommend annual imaging followup by CTA or MRA. This recommendation follows 2010 ACCF/AHA/ AATS/ACR/ASA/SCA/SCAI/SIR/STS/SVM Guidelines for the Diagnosis and Management of Patients with Thoracic Aortic Disease. Circulation. 2010; 121: Z733-z630. Aortic aneurysm NOS (ICD10-I71.9). 2. Aortic and coronary artery atherosclerosis. 3. Small hiatal hernia. 4. Hepatic  steatosis. 5. 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 6. Occasional subcentimeter nodules both lower lobes, stable since 2006 consistent with a benign process.     Electronically Signed   By: Francis Quam M.D.   On: 01/26/2023 21:07    IMPRESSION: 1. Mild aneurysmal dilation of the ascending thoracic aorta to 4 cm. Recommend annual imaging followup by CTA or MRA. This recommendation follows 2010 ACCF/AHA/AATS/ACR/ASA/SCA/SCAI/SIR/STS/SVM Guidelines for the Diagnosis and Management of Patients with Thoracic Aortic Disease. Circulation. 2010; 121: [id number]. Aortic aneurysm NOS (ICD10-I71.9) 2. Large LEFT-sided bulla has increased in size since 2006. 3. Tiny hiatal hernia with circumferential prominence of the distal esophageal wall which may reflect underlying esophagitis.   Aortic Atherosclerosis (ICD10-I70.0).     Electronically Signed   By: Corean Salter M.D.   On: 10/24/2021 08: EKG Interpretation Date/Time:                  Tuesday January 01 2023 90:71:72 EDT Ventricular Rate:         75 PR Interval:                 114 QRS Duration:             80 QT Interval:                 378 QTC Calculation: 422 R Axis:                         43   Text Interpretation: Sinus rhythm with  Premature supraventricular complexes Nonspecific T wave abnormality When compared with ECG of 24-Feb-2005 16:24, Premature supraventricular complexes are now Present Nonspecific T wave abnormality, worse in Inferior leads Confirmed by Parthenia Klinefelter [phone]) on 01/01/2023 9:46:53 AM     Prior CV Studies:      CTA 10/2021 IMPRESSION: 1. Mild aneurysmal dilation of the ascending thoracic aorta to 4 cm. Recommend annual imaging followup by CTA or MRA. This recommendation follows 2010 ACCF/AHA/AATS/ACR/ASA/SCA/SCAI/SIR/STS/SVM Guidelines for the Diagnosis and Management of Patients with Thoracic Aortic Disease. Circulation. 2010;  121: [id number]. Aortic aneurysm NOS (ICD10-I71.9) 2. Large LEFT-sided bulla has increased in size since 2006. 3. Tiny hiatal hernia with circumferential prominence of the distal esophageal wall which may reflect underlying esophagitis.   Aortic Atherosclerosis (ICD10-I70.0).     Electronically Signed   By: Corean Salter M.D.   On: 10/24/2021 08:45    Risk Assessment/Calculations:             Physical Exam:   VS:  BP 118/74 (BP Location: Left Arm, Patient Position: Sitting, Cuff Size: Large)   Pulse 72   Ht 5' 8 (1.727 m)   Wt 246 lb (111.6 kg)   BMI 37.40 kg/m    Orhtostatics: No data found. Wt Readings from Last 3 Encounters:  05/04/24 246 lb (111.6 kg)  01/01/23 246 lb 9.6 oz (111.9 kg)  11/16/21 268 lb (121.6 kg)    GEN: Obese, in no acute distress NECK: No JVD; No carotid bruits CARDIAC:  RRR, no murmurs, rubs, gallops RESPIRATORY:  Clear to auscultation without rales, wheezing or rhonchi  ABDOMEN: Soft, non-tender, non-distended EXTREMITIES: chronic lymphedema right LE; No deformity   ASSESSMENT AND PLAN: .    HTN: BP is well controlled on tenoretic, coreg , chlorthalidone  and losartan    Chronic diastolic CHF:  No change in swelling in his right leg which is due to lymphedema and was not changed with Lasix . He continues on chlorthalidone .    Thoracic aortic aneurysm: Ascending aorta 4.1 cm  chest CTA 01/2023. Needs repeat.Check labs   HLD-on crestor  due for repeat labs. Was previously on zetia  but he's not sure why or when he stopped it.  Obesity-exercise and weight loss discussed. He lost 57 lbs in the past year by not eating ice cream.  -150 min exercise a week discussed.               Dispo: f/u 1 yr Dr. Verlin  Signed, Klinefelter Parthenia, PA-C

## 2024-05-04 ENCOUNTER — Encounter: Payer: Self-pay | Admitting: Physician Assistant

## 2024-05-04 ENCOUNTER — Ambulatory Visit: Attending: Physician Assistant | Admitting: Physician Assistant

## 2024-05-04 VITALS — BP 118/74 | HR 72 | Ht 68.0 in | Wt 246.0 lb

## 2024-05-04 DIAGNOSIS — I10 Essential (primary) hypertension: Secondary | ICD-10-CM | POA: Diagnosis not present

## 2024-05-04 DIAGNOSIS — I712 Thoracic aortic aneurysm, without rupture, unspecified: Secondary | ICD-10-CM | POA: Diagnosis not present

## 2024-05-04 DIAGNOSIS — E782 Mixed hyperlipidemia: Secondary | ICD-10-CM

## 2024-05-04 DIAGNOSIS — Z6837 Body mass index (BMI) 37.0-37.9, adult: Secondary | ICD-10-CM

## 2024-05-04 DIAGNOSIS — I5032 Chronic diastolic (congestive) heart failure: Secondary | ICD-10-CM | POA: Diagnosis not present

## 2024-05-04 DIAGNOSIS — E66812 Obesity, class 2: Secondary | ICD-10-CM

## 2024-05-04 DIAGNOSIS — E6609 Other obesity due to excess calories: Secondary | ICD-10-CM

## 2024-05-04 NOTE — Patient Instructions (Addendum)
 Medication Instructions:  NO CHANGES  Lab Work: CMET, CBC, TSH, AND FASTING LIPID PANEL TO BE DONE IN ABOUT 1 WEEK.  Testing/Procedures: Non-Cardiac CT Angiography (CTA), is a special type of CT scan that uses a computer to produce multi-dimensional views of major blood vessels throughout the body. In CT angiography, a contrast material is injected through an IV to help visualize the blood vessels   Follow-Up: At [facility], you and your health needs are our priority.  As part of our continuing mission to provide you with exceptional heart care, our providers are all part of one team.  This team includes your primary Cardiologist (physician) and Advanced Practice Providers or APPs (Physician Assistants and Nurse Practitioners) who all work together to provide you with the care you need, when you need it.  Your next appointment:   1 year  Provider:   Lonni Cash, MD    Other Instructions:  [facility] 4.3(107)  Hospital 10.5 mi  [address]  [phone] Open 24 hours

## 2024-06-01 ENCOUNTER — Other Ambulatory Visit (HOSPITAL_BASED_OUTPATIENT_CLINIC_OR_DEPARTMENT_OTHER)

## 2024-06-11 ENCOUNTER — Ambulatory Visit (HOSPITAL_BASED_OUTPATIENT_CLINIC_OR_DEPARTMENT_OTHER)
Admission: RE | Admit: 2024-06-11 | Discharge: 2024-06-11 | Disposition: A | Discharge status: Home / Self Care | Source: Ambulatory Visit | Attending: Physician Assistant | Admitting: Physician Assistant

## 2024-06-11 ENCOUNTER — Encounter (HOSPITAL_BASED_OUTPATIENT_CLINIC_OR_DEPARTMENT_OTHER): Payer: Self-pay

## 2024-06-11 DIAGNOSIS — I712 Thoracic aortic aneurysm, without rupture, unspecified: Secondary | ICD-10-CM | POA: Diagnosis present

## 2024-06-11 DIAGNOSIS — I10 Essential (primary) hypertension: Secondary | ICD-10-CM | POA: Diagnosis present

## 2024-06-11 MED ORDER — IOHEXOL 350 MG/ML SOLN
100.0000 mL | Freq: Once | INTRAVENOUS | Status: AC | PRN
  Administered 2024-06-11: 100 mL via INTRAVENOUS

## 2024-06-12 ENCOUNTER — Ambulatory Visit: Payer: Self-pay | Admitting: Physician Assistant

## 2024-06-29 ENCOUNTER — Ambulatory Visit: Payer: Self-pay | Admitting: Podiatry
